# Patient Record
Sex: Male | Born: 1990
Health system: Southern US, Community
[De-identification: ages and names within clinical notes are randomized; demographics above are authoritative.]

## PROBLEM LIST (undated history)

## (undated) DIAGNOSIS — F32A Depression, unspecified: Secondary | ICD-10-CM

## (undated) DIAGNOSIS — M549 Dorsalgia, unspecified: Secondary | ICD-10-CM

## (undated) DIAGNOSIS — G8929 Other chronic pain: Secondary | ICD-10-CM

## (undated) HISTORY — PX: OTHER SURGICAL HISTORY: SHX169

---
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
## 2010-02-04 ENCOUNTER — Emergency Department (HOSPITAL_BASED_OUTPATIENT_CLINIC_OR_DEPARTMENT_OTHER)
Admission: EM | Admit: 2010-02-04 | Discharge: 2010-02-04 | Payer: Self-pay | Source: Home / Self Care | Admitting: Emergency Medicine

## 2010-02-04 LAB — CBC
HCT: 41.2 % (ref 39.0–52.0)
MCH: 27.5 pg (ref 26.0–34.0)
MCHC: 33.7 g/dL (ref 30.0–36.0)
MCV: 81.4 fL (ref 78.0–100.0)
RDW: 11.9 % (ref 11.5–15.5)
WBC: 4.3 10*3/uL (ref 4.0–10.5)

## 2010-02-04 LAB — COMPREHENSIVE METABOLIC PANEL
ALT: 50 U/L (ref 0–53)
AST: 66 U/L — ABNORMAL HIGH (ref 0–37)
CO2: 29 mEq/L (ref 19–32)
Chloride: 103 mEq/L (ref 96–112)
Creatinine, Ser: 0.8 mg/dL (ref 0.4–1.5)
GFR calc Af Amer: >60 mL/min (ref >60)
GFR calc non Af Amer: >60 mL/min (ref >60)
Total Bilirubin: 1.3 mg/dL — ABNORMAL HIGH (ref 0.3–1.2)

## 2010-02-04 LAB — DIFFERENTIAL
Eosinophils Relative: 2 % (ref 0–5)
Lymphocytes Relative: 43 % (ref 12–46)
Lymphs Abs: 1.9 10*3/uL (ref 0.7–4.0)
Monocytes Absolute: 0.5 10*3/uL (ref 0.1–1.0)
Monocytes Relative: 11 % (ref 3–12)

## 2010-02-04 LAB — URINALYSIS, ROUTINE W REFLEX MICROSCOPIC
Bilirubin Urine: NEGATIVE
Hgb urine dipstick: NEGATIVE
Specific Gravity, Urine: 1.019 (ref 1.005–1.030)
Urobilinogen, UA: 1 mg/dL (ref 0.0–1.0)

## 2010-02-04 LAB — POCT TOXICOLOGY PANEL: Tetrahydrocannabinol: POSITIVE

## 2013-07-25 ENCOUNTER — Encounter (HOSPITAL_BASED_OUTPATIENT_CLINIC_OR_DEPARTMENT_OTHER): Payer: Self-pay | Admitting: Emergency Medicine

## 2013-07-25 ENCOUNTER — Emergency Department (HOSPITAL_BASED_OUTPATIENT_CLINIC_OR_DEPARTMENT_OTHER)
Admission: EM | Admit: 2013-07-25 | Discharge: 2013-07-25 | Disposition: A | Discharge status: Home / Self Care | Payer: Medicaid Other | Attending: Emergency Medicine | Admitting: Emergency Medicine

## 2013-07-25 DIAGNOSIS — R369 Urethral discharge, unspecified: Secondary | ICD-10-CM | POA: Diagnosis present

## 2013-07-25 DIAGNOSIS — F17200 Nicotine dependence, unspecified, uncomplicated: Secondary | ICD-10-CM | POA: Diagnosis not present

## 2013-07-25 DIAGNOSIS — R319 Hematuria, unspecified: Secondary | ICD-10-CM | POA: Insufficient documentation

## 2013-07-25 DIAGNOSIS — Z880 Allergy status to penicillin: Secondary | ICD-10-CM | POA: Insufficient documentation

## 2013-07-25 LAB — CBC WITH DIFFERENTIAL/PLATELET
BASOS ABS: 0 10*3/uL (ref 0.0–0.1)
BASOS PCT: 0 % (ref 0–1)
Eosinophils Absolute: 0.1 10*3/uL (ref 0.0–0.7)
Eosinophils Relative: 2 % (ref 0–5)
HCT: 41.2 % (ref 39.0–52.0)
Hemoglobin: 13.6 g/dL (ref 13.0–17.0)
Lymphocytes Relative: 40 % (ref 12–46)
Lymphs Abs: 2.2 10*3/uL (ref 0.7–4.0)
MCH: 27.7 pg (ref 26.0–34.0)
MCHC: 33 g/dL (ref 30.0–36.0)
MCV: 83.9 fL (ref 78.0–100.0)
Monocytes Absolute: 0.7 10*3/uL (ref 0.1–1.0)
Monocytes Relative: 13 % — ABNORMAL HIGH (ref 3–12)
NEUTROS PCT: 45 % (ref 43–77)
Neutro Abs: 2.4 10*3/uL (ref 1.7–7.7)
PLATELETS: 287 10*3/uL (ref 150–400)
RBC: 4.91 MIL/uL (ref 4.22–5.81)
RDW: 12.6 % (ref 11.5–15.5)
WBC: 5.3 10*3/uL (ref 4.0–10.5)

## 2013-07-25 LAB — BASIC METABOLIC PANEL
Anion gap: 15 (ref 5–15)
BUN: 7 mg/dL (ref 6–23)
CALCIUM: 9.7 mg/dL (ref 8.4–10.5)
CO2: 25 mEq/L (ref 19–32)
Chloride: 100 mEq/L (ref 96–112)
Creatinine, Ser: 0.8 mg/dL (ref 0.50–1.35)
Glucose, Bld: 101 mg/dL — ABNORMAL HIGH (ref 70–99)
POTASSIUM: 4.1 meq/L (ref 3.7–5.3)
Sodium: 140 mEq/L (ref 137–147)

## 2013-07-25 LAB — URINALYSIS, ROUTINE W REFLEX MICROSCOPIC
Glucose, UA: NEGATIVE mg/dL
Ketones, ur: 15 mg/dL — AB
NITRITE: POSITIVE — AB
PH: 6 (ref 5.0–8.0)
Protein, ur: 100 mg/dL — AB
SPECIFIC GRAVITY, URINE: 1.03 (ref 1.005–1.030)
Urobilinogen, UA: 4 mg/dL — ABNORMAL HIGH (ref 0.0–1.0)

## 2013-07-25 LAB — URINE MICROSCOPIC-ADD ON

## 2013-07-25 NOTE — ED Provider Notes (Signed)
CSN: [id number]     Arrival date & time 07/25/13  1156 History   First MD Initiated Contact with Patient 07/25/13 1212     Chief Complaint  Patient presents with  . Penile Discharge    Bloody     (Consider location/radiation/quality/duration/timing/severity/associated sxs/prior Treatment) Patient is a 23 y.o. male presenting with hematuria. The history is provided by the patient. No language interpreter was used.  Hematuria This is a new problem. The current episode started today. The problem occurs constantly. The problem has been unchanged. Pertinent negatives include no abdominal pain or fever. Nothing aggravates the symptoms. He has tried nothing for the symptoms. The treatment provided no relief.  Pt reports after sex he noticed blood in with semen.   Pt reports he urinated and noticed blood at end of urine stream.     History reviewed. No pertinent past medical history. History reviewed. No pertinent past surgical history. No family history on file. History  Substance Use Topics  . Smoking status: Current Every Day Smoker -- 1.00 packs/day    Types: Cigarettes  . Smokeless tobacco: Never Used  . Alcohol Use: Yes     Comment: occassional    Review of Systems  Constitutional: Negative for fever.  Gastrointestinal: Negative for abdominal pain.  Genitourinary: Positive for hematuria.  All other systems reviewed and are negative.     Allergies  Penicillins  Home Medications   Prior to Admission medications   Not on File   BP 129/63  Pulse 40  Temp(Src) 97.9 F (36.6 C) (Oral)  Resp 18  Ht 6' (1.829 m)  Wt 212 lb (96.163 kg)  BMI 28.75 kg/m2  SpO2 100% Physical Exam  Nursing note and vitals reviewed. Constitutional: He is oriented to person, place, and time. He appears well-developed and well-nourished.  HENT:  Head: Normocephalic.  Eyes: EOM are normal. Pupils are equal, round, and reactive to light.  Neck: Normal range of motion.  Pulmonary/Chest: Effort  normal.  Abdominal: Soft. He exhibits no distension.  Genitourinary: Penis normal. No penile tenderness.  Musculoskeletal: Normal range of motion.  Neurological: He is alert and oriented to person, place, and time.  Psychiatric: He has a normal mood and affect.    ED Course  Procedures (including critical care time) Labs Review Labs Reviewed  URINALYSIS, ROUTINE W REFLEX MICROSCOPIC - Abnormal; Notable for the following:    Color, Urine RED (*)    APPearance TURBID (*)    Hgb urine dipstick LARGE (*)    Bilirubin Urine LARGE (*)    Ketones, ur 15 (*)    Protein, ur 100 (*)    Urobilinogen, UA 4.0 (*)    Nitrite POSITIVE (*)    Leukocytes, UA MODERATE (*)    All other components within normal limits  URINE MICROSCOPIC-ADD ON - Abnormal; Notable for the following:    Bacteria, UA FEW (*)    All other components within normal limits  GC/CHLAMYDIA PROBE AMP    Imaging Review No results found.   EKG Interpretation None      MDM   Final diagnoses:  Hematuria    Pt advised no sex or masterbation x 3 days,  Drink plenty of fluids  Follow up with urology if bleeding persist      Elson AreasLeslie K Sofia, PA-C 07/25/13 1758

## 2013-07-25 NOTE — ED Notes (Signed)
Patient states he woke up this morning around 0400 this morning with an erection.  States when the erection went down, he ejaculate was blood mixed with semen.  States he than began to have bloody discharge.  States when urinating, his urine is normal color with small blood clots at the end of the stream.  Denies pain. States he had unprotected intercourse one to two weeks ago.

## 2013-07-25 NOTE — Discharge Instructions (Signed)
Hematuria, Adult  Hematuria is blood in your urine. It can be caused by a bladder infection, kidney infection, prostate infection, kidney stone, or cancer of your urinary tract. Infections can usually be treated with medicine, and a kidney stone usually will pass through your urine. If neither of these is the cause of your hematuria, further workup to find out the reason may be needed.  It is very important that you tell your health care provider about any blood you see in your urine, even if the blood stops without treatment or happens without causing pain. Blood in your urine that happens and then stops and then happens again can be a symptom of a very serious condition. Also, pain is not a symptom in the initial stages of many urinary cancers.  HOME CARE INSTRUCTIONS   · Drink lots of fluid, 3-4 quarts a day. If you have been diagnosed with an infection, cranberry juice is especially recommended, in addition to large amounts of water.  · Avoid caffeine, tea, and carbonated beverages, because they tend to irritate the bladder.  · Avoid alcohol because it may irritate the prostate.  · Only take over-the-counter or prescription medicines for pain, discomfort, or fever as directed by your health care provider.  · If you have been diagnosed with a kidney stone, follow your health care provider's instructions regarding straining your urine to catch the stone.  · Empty your bladder often. Avoid holding urine for long periods of time.  · After a bowel movement, women should cleanse front to back. Use each tissue only once.  · Empty your bladder before and after sexual intercourse if you are a male.  SEEK MEDICAL CARE IF:  You develop back pain, fever, a feeling of sickness in your stomach (nausea), or vomiting or if your symptoms are not better in 3 days. Return sooner if you are getting worse.  SEEK IMMEDIATE MEDICAL CARE IF:   · You have a persistent fever, with a temperature of 101.8°F (38.8°C) or greater.  · You  develop severe vomiting and are unable to keep the medicine down.  · You develop severe back or abdominal pain despite taking your medicines.  · You begin passing a large amount of blood or clots in your urine.  · You feel extremely weak or faint, or you pass out.  MAKE SURE YOU:   · Understand these instructions.  · Will watch your condition.  · Will get help right away if you are not doing well or get worse.  Document Released: 12/28/2004 Document Revised: 10/18/2012 Document Reviewed: 08/28/2012  [employer]® Patient Information ©2015 [employer]. This information is not intended to replace advice given to you by your health care provider. Make sure you discuss any questions you have with your health care provider.

## 2013-07-26 LAB — GC/CHLAMYDIA PROBE AMP
CT Probe RNA: NEGATIVE
GC PROBE AMP APTIMA: NEGATIVE

## 2013-07-27 NOTE — ED Provider Notes (Signed)
Medical screening examination/treatment/procedure(s) were performed by non-physician practitioner and as supervising physician I was immediately available for consultation/collaboration.   EKG Interpretation None       Juliet RudeNathan R. Rubin PayorPickering, MD 07/27/13 1049

## 2013-07-29 ENCOUNTER — Emergency Department (HOSPITAL_BASED_OUTPATIENT_CLINIC_OR_DEPARTMENT_OTHER)
Admission: EM | Admit: 2013-07-29 | Discharge: 2013-07-29 | Disposition: A | Discharge status: Home / Self Care | Payer: Medicaid Other | Attending: Emergency Medicine | Admitting: Emergency Medicine

## 2013-07-29 ENCOUNTER — Encounter (HOSPITAL_BASED_OUTPATIENT_CLINIC_OR_DEPARTMENT_OTHER): Payer: Self-pay | Admitting: Emergency Medicine

## 2013-07-29 DIAGNOSIS — N342 Other urethritis: Secondary | ICD-10-CM | POA: Diagnosis not present

## 2013-07-29 DIAGNOSIS — F17200 Nicotine dependence, unspecified, uncomplicated: Secondary | ICD-10-CM | POA: Diagnosis not present

## 2013-07-29 DIAGNOSIS — B379 Candidiasis, unspecified: Secondary | ICD-10-CM | POA: Insufficient documentation

## 2013-07-29 DIAGNOSIS — Z880 Allergy status to penicillin: Secondary | ICD-10-CM | POA: Diagnosis not present

## 2013-07-29 DIAGNOSIS — R319 Hematuria, unspecified: Secondary | ICD-10-CM | POA: Diagnosis present

## 2013-07-29 LAB — HEPATIC FUNCTION PANEL
ALK PHOS: 82 U/L (ref 39–117)
ALT: 11 U/L (ref 0–53)
AST: 19 U/L (ref 0–37)
Albumin: 4.5 g/dL (ref 3.5–5.2)
BILIRUBIN TOTAL: 0.6 mg/dL (ref 0.3–1.2)
Bilirubin, Direct: <0.2 mg/dL (ref 0.0–0.3)
Total Protein: 8.2 g/dL (ref 6.0–8.3)

## 2013-07-29 LAB — URINALYSIS, ROUTINE W REFLEX MICROSCOPIC
Bilirubin Urine: NEGATIVE
Glucose, UA: NEGATIVE mg/dL
Ketones, ur: NEGATIVE mg/dL
LEUKOCYTES UA: NEGATIVE
Nitrite: NEGATIVE
Protein, ur: NEGATIVE mg/dL
Specific Gravity, Urine: 1.023 (ref 1.005–1.030)
UROBILINOGEN UA: 1 mg/dL (ref 0.0–1.0)
pH: 6 (ref 5.0–8.0)

## 2013-07-29 LAB — URINE MICROSCOPIC-ADD ON

## 2013-07-29 LAB — RPR

## 2013-07-29 LAB — HIV ANTIBODY (ROUTINE TESTING W REFLEX): HIV 1&2 Ab, 4th Generation: NONREACTIVE

## 2013-07-29 MED ORDER — LIDOCAINE HCL (PF) 1 % IJ SOLN
INTRAMUSCULAR | Status: AC
  Administered 2013-07-29: 18:00:00
  Filled 2013-07-29: qty 5

## 2013-07-29 MED ORDER — CIPROFLOXACIN HCL 500 MG PO TABS: 500.0000 mg | ORAL_TABLET | Freq: Two times a day (BID) | ORAL | Status: DC

## 2013-07-29 MED ORDER — FLUCONAZOLE 50 MG PO TABS
150.0000 mg | ORAL_TABLET | Freq: Once | ORAL | Status: AC
  Administered 2013-07-29: 150 mg via ORAL
  Filled 2013-07-29 (×2): qty 1

## 2013-07-29 MED ORDER — AZITHROMYCIN 250 MG PO TABS
1000.0000 mg | ORAL_TABLET | Freq: Once | ORAL | Status: AC
  Administered 2013-07-29: 1000 mg via ORAL
  Filled 2013-07-29: qty 4

## 2013-07-29 MED ORDER — CIPROFLOXACIN HCL 500 MG PO TABS
500.0000 mg | ORAL_TABLET | Freq: Once | ORAL | Status: AC
  Administered 2013-07-29: 500 mg via ORAL
  Filled 2013-07-29: qty 1

## 2013-07-29 MED ORDER — CEFTRIAXONE SODIUM 1 G IJ SOLR
1.0000 g | Freq: Once | INTRAMUSCULAR | Status: AC
  Administered 2013-07-29: 1 g via INTRAMUSCULAR
  Filled 2013-07-29: qty 10

## 2013-07-29 MED ORDER — FLUCONAZOLE 200 MG PO TABS: 200.0000 mg | ORAL_TABLET | Freq: Every day | ORAL | Status: DC

## 2013-07-29 NOTE — ED Notes (Signed)
Pt presents to ED with complaints of blood in his urine. Pt reports he was here 3 days ago for the same thing and is is not getting any better.

## 2013-07-29 NOTE — ED Provider Notes (Signed)
CSN: [id number]     Arrival date & time 07/29/13  1503 History   First MD Initiated Contact with Patient 07/29/13 1654     Chief Complaint  Patient presents with  . Hematuria     (Consider location/radiation/quality/duration/timing/severity/associated sxs/prior Treatment) HPI  Andrew [name] is a 23 y.o. male complaining of hematuria onset approximately 4 days ago. Patient states that he had to strain to begin urination this morning. He denies any purulent urethral discharge or dysuria. Patient had unprotected sex when the condom that he was wearing 2 weeks ago broke. He denies any known sex, testicular pain swelling, fever, chills, rash, lesion, abdominal pain, nausea vomiting. Patient was seen for hematuria 3 days ago. GC chlamydia that was apparently obtained by UA was negative.  History reviewed. No pertinent past medical history. History reviewed. No pertinent past surgical history. History reviewed. No pertinent family history. History  Substance Use Topics  . Smoking status: Current Every Day Smoker -- 1.00 packs/day    Types: Cigarettes  . Smokeless tobacco: Never Used  . Alcohol Use: Yes     Comment: occassional    Review of Systems  10 systems reviewed and found to be negative, except as noted in the HPI.   Allergies  Penicillins  Home Medications   Prior to Admission medications   Medication Sig Start Date End Date Taking? Authorizing Provider  ciprofloxacin (CIPRO) 500 MG tablet Take 1 tablet (500 mg total) by mouth every 12 (twelve) hours. 07/29/13   Aeden Matranga, PA-C  fluconazole (DIFLUCAN) 200 MG tablet Take 1 tablet (200 mg total) by mouth daily. 07/29/13   Devynn Scheff, PA-C   BP 153/86  Pulse 50  Temp(Src) 98.1 F (36.7 C) (Oral)  Resp 18  Ht 6' (1.829 m)  Wt 212 lb (96.163 kg)  BMI 28.75 kg/m2  SpO2 100% Physical Exam  Nursing note and vitals reviewed. Constitutional: He is oriented to person, place, and time. He appears well-developed  and well-nourished. No distress.  HENT:  Head: Normocephalic and atraumatic.  Mouth/Throat: Oropharynx is clear and moist.  Eyes: Conjunctivae and EOM are normal.  Neck: Normal range of motion. Neck supple.  Cardiovascular: Normal rate, regular rhythm and intact distal pulses.   Pulmonary/Chest: Effort normal and breath sounds normal. No stridor.  Abdominal: Soft. Bowel sounds are normal. He exhibits no distension and no mass. There is no tenderness. There is no rebound and no guarding.  Genitourinary: No penile tenderness.  GU exam chaperoned by nurse:  No rashes or lesion, no testicular pain or swelling. No urethral discharge.  Musculoskeletal: Normal range of motion.  Neurological: He is alert and oriented to person, place, and time.  Psychiatric: He has a normal mood and affect.    ED Course  Procedures (including critical care time) Labs Review Labs Reviewed  URINALYSIS, ROUTINE W REFLEX MICROSCOPIC - Abnormal; Notable for the following:    APPearance CLOUDY (*)    Hgb urine dipstick LARGE (*)    All other components within normal limits  URINE MICROSCOPIC-ADD ON - Abnormal; Notable for the following:    Bacteria, UA MANY (*)    All other components within normal limits  GC/CHLAMYDIA PROBE AMP  URINE CULTURE  RPR  HIV ANTIBODY (ROUTINE TESTING)  HEPATIC FUNCTION PANEL    Imaging Review No results found.   EKG Interpretation None      MDM   Final diagnoses:  Urethritis  Yeast infection    Filed Vitals:   07/29/13 1513 07/29/13 1757  BP: 117/67 153/86  Pulse: 100 50  Temp: 98.3 F (36.8 C) 98.1 F (36.7 C)  TempSrc: Oral Oral  Resp: 18 18  Height: 6' (1.829 m)   Weight: 212 lb (96.163 kg)   SpO2: 100% 100%    Medications  ciprofloxacin (CIPRO) tablet 500 mg (500 mg Oral Given 07/29/13 1730)  cefTRIAXone (ROCEPHIN) injection 1 g (1 g Intramuscular Given 07/29/13 1731)  azithromycin (ZITHROMAX) tablet 1,000 mg (1,000 mg Oral Given 07/29/13 1730)   fluconazole (DIFLUCAN) tablet 150 mg (150 mg Oral Given 07/29/13 1730)  lidocaine (PF) (XYLOCAINE) 1 % injection (  Given by Other 07/29/13 1743)    Andrew [name] is a 23 y.o. male presenting with hematuria and difficulty initiating urine stream. Urinalysis today shows a large amount of hemoglobin with many bacteria and also many yeast. I am going to recheck a gonorrhea and Chlamydia with urethral swab. We'll treat for urinary tract infection, STD and fungal infection. HIV and RPR pending. Patient asked to follow closely with urology.  Evaluation does not show pathology that would require ongoing emergent intervention or inpatient treatment. Pt is hemodynamically stable and mentating appropriately. Discussed findings and plan with patient/guardian, who agrees with care plan. All questions answered. Return precautions discussed and outpatient follow up given.   Discharge Medication List as of 07/29/2013  5:52 PM    START taking these medications   Details  ciprofloxacin (CIPRO) 500 MG tablet Take 1 tablet (500 mg total) by mouth every 12 (twelve) hours., Starting 07/29/2013, Until Discontinued, Print    fluconazole (DIFLUCAN) 200 MG tablet Take 1 tablet (200 mg total) by mouth daily., Starting 07/29/2013, Until Discontinued, Print             Wynetta Emery, PA-C 07/29/13 1824

## 2013-07-29 NOTE — Discharge Instructions (Signed)
Take your antibiotics as directed and to completion. You should never have any leftover antibiotics! Push fluids and stay well hydrated.   Do not hesitate to return to the Emergency Department for any new, worsening or concerning symptoms.   If you do not have a primary care doctor you can establish one at the   [facility]: [address] [place] [id number] [phone]  After you establish care. Let them know you were seen in the emergency room. They must obtain records for further management.    Candida Infection, Adult A candida infection (also called yeast, fungus and Monilia infection) is an overgrowth of yeast that can occur anywhere on the body. A yeast infection commonly occurs in warm, moist body areas. Usually, the infection remains localized but can spread to become a systemic infection. A yeast infection may be a sign of a more severe disease such as diabetes, leukemia, or AIDS. A yeast infection can occur in both men and women. In women, Candida vaginitis is a vaginal infection. It is one of the most common causes of vaginitis. Men usually do not have symptoms or know they have an infection until other problems develop. Men may find out they have a yeast infection because their sex partner has a yeast infection. Uncircumcised men are more likely to get a yeast infection than circumcised men. This is because the uncircumcised glans is not exposed to air and does not remain as dry as that of a circumcised glans. Older adults may develop yeast infections around dentures. CAUSES  Women  Antibiotics.  Steroid medication taken for a long time.  Being overweight (obese).  Diabetes.  Poor immune condition.  Certain serious medical conditions.  Immune suppressive medications for organ transplant patients.  Chemotherapy.  Pregnancy.  Menstration.  Stress and fatigue.  Intravenous drug use.  Oral contraceptives.  Wearing tight-fitting clothes in  the crotch area.  Catching it from a sex partner who has a yeast infection.  Spermicide.  Intravenous, urinary, or other catheters. Men  Catching it from a sex partner who has a yeast infection.  Having oral or anal sex with a person who has the infection.  Spermicide.  Diabetes.  Antibiotics.  Poor immune system.  Medications that suppress the immune system.  Intravenous drug use.  Intravenous, urinary, or other catheters. SYMPTOMS  Women  Thick, white vaginal discharge.  Vaginal itching.  Redness and swelling in and around the vagina.  Irritation of the lips of the vagina and perineum.  Blisters on the vaginal lips and perineum.  Painful sexual intercourse.  Low blood sugar (hypoglycemia).  Painful urination.  Bladder infections.  Intestinal problems such as constipation, indigestion, bad breath, bloating, increase in gas, diarrhea, or loose stools. Men  Men may develop intestinal problems such as constipation, indigestion, bad breath, bloating, increase in gas, diarrhea, or loose stools.  Dry, cracked skin on the penis with itching or discomfort.  Jock itch.  Dry, flaky skin.  Athlete's foot.  Hypoglycemia. DIAGNOSIS  Women  A history and an exam are performed.  The discharge may be examined under a microscope.  A culture may be taken of the discharge. Men  A history and an exam are performed.  Any discharge from the penis or areas of cracked skin will be looked at under the microscope and cultured.  Stool samples may be cultured. TREATMENT  Women  Vaginal antifungal suppositories and creams.  Medicated creams to decrease irritation and itching on the outside of the  vagina.  Warm compresses to the perineal area to decrease swelling and discomfort.  Oral antifungal medications.  Medicated vaginal suppositories or cream for repeated or recurrent infections.  Wash and dry the irritation areas before applying the cream.  Eating  yogurt with lactobacillus may help with prevention and treatment.  Sometimes painting the vagina with gentian violet solution may help if creams and suppositories do not work. Men  Antifungal creams and oral antifungal medications.  Sometimes treatment must continue for 30 days after the symptoms go away to prevent recurrence. HOME CARE INSTRUCTIONS  Women  Use cotton underwear and avoid tight-fitting clothing.  Avoid colored, scented toilet paper and deodorant tampons or pads.  Do not douche.  Keep your diabetes under control.  Finish all the prescribed medications.  Keep your skin clean and dry.  Consume milk or yogurt with lactobacillus active culture regularly. If you get frequent yeast infections and think that is what the infection is, there are over-the-counter medications that you can get. If the infection does not show healing in 3 days, talk to your caregiver.  Tell your sex partner you have a yeast infection. Your partner may need treatment also, especially if your infection does not clear up or recurs. Men  Keep your skin clean and dry.  Keep your diabetes under control.  Finish all prescribed medications.  Tell your sex partner that you have a yeast infection so they can be treated if necessary. SEEK MEDICAL CARE IF:   Your symptoms do not clear up or worsen in one week after treatment.  You have an oral temperature above 102 F (38.9 C).  You have trouble swallowing or eating for a prolonged time.  You develop blisters on and around your vagina.  You develop vaginal bleeding and it is not your menstrual period.  You develop abdominal pain.  You develop intestinal problems as mentioned above.  You get weak or lightheaded.  You have painful or increased urination.  You have pain during sexual intercourse. MAKE SURE YOU:   Understand these instructions.  Will watch your condition.  Will get help right away if you are not doing well or get  worse. Document Released: 02/05/2004 Document Revised: 03/22/2011 Document Reviewed: 05/19/2009 [facility] Patient Information 2015 [place], [place]. This information is not intended to replace advice given to you by your health care provider. Make sure you discuss any questions you have with your health care provider.

## 2013-07-30 LAB — GC/CHLAMYDIA PROBE AMP
CT PROBE, AMP APTIMA: NEGATIVE
GC Probe RNA: NEGATIVE

## 2013-07-30 NOTE — ED Provider Notes (Signed)
Medical screening examination/treatment/procedure(s) were performed by non-physician practitioner and as supervising physician I was immediately available for consultation/collaboration.   EKG Interpretation None       Andrew HornJohn M Holly Iannaccone, MD 07/30/13 2210

## 2013-07-31 LAB — URINE CULTURE
COLONY COUNT: NO GROWTH
Culture: NO GROWTH

## 2013-10-14 ENCOUNTER — Encounter (HOSPITAL_BASED_OUTPATIENT_CLINIC_OR_DEPARTMENT_OTHER): Payer: Self-pay | Admitting: Emergency Medicine

## 2013-10-14 ENCOUNTER — Emergency Department (HOSPITAL_BASED_OUTPATIENT_CLINIC_OR_DEPARTMENT_OTHER): Payer: Medicaid Other

## 2013-10-14 ENCOUNTER — Emergency Department (HOSPITAL_BASED_OUTPATIENT_CLINIC_OR_DEPARTMENT_OTHER)
Admission: EM | Admit: 2013-10-14 | Discharge: 2013-10-14 | Disposition: A | Discharge status: Home / Self Care | Payer: Medicaid Other | Attending: Emergency Medicine | Admitting: Emergency Medicine

## 2013-10-14 DIAGNOSIS — Z720 Tobacco use: Secondary | ICD-10-CM | POA: Insufficient documentation

## 2013-10-14 DIAGNOSIS — Z792 Long term (current) use of antibiotics: Secondary | ICD-10-CM | POA: Insufficient documentation

## 2013-10-14 DIAGNOSIS — Y92410 Unspecified street and highway as the place of occurrence of the external cause: Secondary | ICD-10-CM | POA: Diagnosis not present

## 2013-10-14 DIAGNOSIS — S39012A Strain of muscle, fascia and tendon of lower back, initial encounter: Secondary | ICD-10-CM

## 2013-10-14 DIAGNOSIS — Z880 Allergy status to penicillin: Secondary | ICD-10-CM | POA: Diagnosis not present

## 2013-10-14 DIAGNOSIS — S161XXA Strain of muscle, fascia and tendon at neck level, initial encounter: Secondary | ICD-10-CM

## 2013-10-14 DIAGNOSIS — Y9389 Activity, other specified: Secondary | ICD-10-CM | POA: Insufficient documentation

## 2013-10-14 DIAGNOSIS — S0990XA Unspecified injury of head, initial encounter: Secondary | ICD-10-CM

## 2013-10-14 DIAGNOSIS — S098XXA Other specified injuries of head, initial encounter: Secondary | ICD-10-CM | POA: Insufficient documentation

## 2013-10-14 MED ORDER — HYDROCODONE-ACETAMINOPHEN 5-325 MG PO TABS: 1.0000 | ORAL_TABLET | Freq: Four times a day (QID) | ORAL | Status: DC | PRN

## 2013-10-14 MED ORDER — IBUPROFEN 800 MG PO TABS
800.0000 mg | ORAL_TABLET | Freq: Once | ORAL | Status: AC
  Administered 2013-10-14: 800 mg via ORAL
  Filled 2013-10-14: qty 1

## 2013-10-14 MED ORDER — IBUPROFEN 800 MG PO TABS
800.0000 mg | ORAL_TABLET | Freq: Once | ORAL | Status: DC
  Filled 2013-10-14: qty 1
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
## 2014-05-13 ENCOUNTER — Emergency Department (HOSPITAL_COMMUNITY)
Admission: EM | Admit: 2014-05-13 | Discharge: 2014-05-13 | Disposition: A | Discharge status: Home / Self Care | Payer: Self-pay | Attending: Emergency Medicine | Admitting: Emergency Medicine

## 2014-05-13 ENCOUNTER — Encounter (HOSPITAL_COMMUNITY): Payer: Self-pay | Admitting: Oncology

## 2014-05-13 DIAGNOSIS — R000 Tachycardia, unspecified: Secondary | ICD-10-CM | POA: Insufficient documentation

## 2014-05-13 DIAGNOSIS — S60511A Abrasion of right hand, initial encounter: Secondary | ICD-10-CM | POA: Insufficient documentation

## 2014-05-13 DIAGNOSIS — F1210 Cannabis abuse, uncomplicated: Secondary | ICD-10-CM | POA: Insufficient documentation

## 2014-05-13 DIAGNOSIS — S70311A Abrasion, right thigh, initial encounter: Secondary | ICD-10-CM | POA: Insufficient documentation

## 2014-05-13 DIAGNOSIS — Y998 Other external cause status: Secondary | ICD-10-CM | POA: Insufficient documentation

## 2014-05-13 DIAGNOSIS — S90811A Abrasion, right foot, initial encounter: Secondary | ICD-10-CM | POA: Insufficient documentation

## 2014-05-13 DIAGNOSIS — F23 Brief psychotic disorder: Secondary | ICD-10-CM

## 2014-05-13 DIAGNOSIS — S60512A Abrasion of left hand, initial encounter: Secondary | ICD-10-CM | POA: Insufficient documentation

## 2014-05-13 DIAGNOSIS — Y929 Unspecified place or not applicable: Secondary | ICD-10-CM | POA: Insufficient documentation

## 2014-05-13 DIAGNOSIS — Y939 Activity, unspecified: Secondary | ICD-10-CM | POA: Insufficient documentation

## 2014-05-13 DIAGNOSIS — F29 Unspecified psychosis not due to a substance or known physiological condition: Secondary | ICD-10-CM | POA: Insufficient documentation

## 2014-05-13 DIAGNOSIS — S40811A Abrasion of right upper arm, initial encounter: Secondary | ICD-10-CM | POA: Insufficient documentation

## 2014-05-13 LAB — RAPID URINE DRUG SCREEN, HOSP PERFORMED
AMPHETAMINES: NOT DETECTED
BENZODIAZEPINES: NOT DETECTED
Barbiturates: NOT DETECTED
Cocaine: NOT DETECTED
Opiates: NOT DETECTED
Tetrahydrocannabinol: POSITIVE — AB

## 2014-05-13 LAB — COMPREHENSIVE METABOLIC PANEL
ALBUMIN: 4.6 g/dL (ref 3.5–5.0)
ALT: 18 U/L (ref 17–63)
AST: 30 U/L (ref 15–41)
Alkaline Phosphatase: 71 U/L (ref 38–126)
Anion gap: 7 (ref 5–15)
BILIRUBIN TOTAL: 1.1 mg/dL (ref 0.3–1.2)
BUN: 13 mg/dL (ref 6–20)
CHLORIDE: 105 mmol/L (ref 101–111)
CO2: 25 mmol/L (ref 22–32)
CREATININE: 0.99 mg/dL (ref 0.61–1.24)
Calcium: 9.3 mg/dL (ref 8.9–10.3)
GFR calc Af Amer: >60 mL/min (ref >60)
GFR calc non Af Amer: >60 mL/min (ref >60)
GLUCOSE: 90 mg/dL (ref 70–99)
POTASSIUM: 3.6 mmol/L (ref 3.5–5.1)
Sodium: 137 mmol/L (ref 135–145)
Total Protein: 7.8 g/dL (ref 6.5–8.1)

## 2014-05-13 LAB — CBC WITH DIFFERENTIAL/PLATELET
BASOS ABS: 0 10*3/uL (ref 0.0–0.1)
Basophils Relative: 0 % (ref 0–1)
EOS PCT: 0 % (ref 0–5)
Eosinophils Absolute: 0 10*3/uL (ref 0.0–0.7)
HEMATOCRIT: 39.5 % (ref 39.0–52.0)
Hemoglobin: 13.2 g/dL (ref 13.0–17.0)
LYMPHS ABS: 0.7 10*3/uL (ref 0.7–4.0)
LYMPHS PCT: 9 % — AB (ref 12–46)
MCH: 28.4 pg (ref 26.0–34.0)
MCHC: 33.4 g/dL (ref 30.0–36.0)
MCV: 85.1 fL (ref 78.0–100.0)
MONOS PCT: 10 % (ref 3–12)
Monocytes Absolute: 0.7 10*3/uL (ref 0.1–1.0)
Neutro Abs: 6 10*3/uL (ref 1.7–7.7)
Neutrophils Relative %: 81 % — ABNORMAL HIGH (ref 43–77)
Platelets: 288 10*3/uL (ref 150–400)
RBC: 4.64 MIL/uL (ref 4.22–5.81)
RDW: 12.7 % (ref 11.5–15.5)
WBC: 7.4 10*3/uL (ref 4.0–10.5)

## 2014-05-13 LAB — URINALYSIS, ROUTINE W REFLEX MICROSCOPIC
Bilirubin Urine: NEGATIVE
Glucose, UA: NEGATIVE mg/dL
Hgb urine dipstick: NEGATIVE
Ketones, ur: NEGATIVE mg/dL
Leukocytes, UA: NEGATIVE
Nitrite: NEGATIVE
Protein, ur: 30 mg/dL — AB
SPECIFIC GRAVITY, URINE: 1.031 — AB (ref 1.005–1.030)
UROBILINOGEN UA: 0.2 mg/dL (ref 0.0–1.0)
pH: 6 (ref 5.0–8.0)

## 2014-05-13 LAB — URINE MICROSCOPIC-ADD ON

## 2014-05-13 LAB — CK: CK TOTAL: 329 U/L (ref 49–397)

## 2014-05-13 LAB — CBG MONITORING, ED: GLUCOSE-CAPILLARY: 93 mg/dL (ref 70–99)

## 2014-05-13 LAB — ETHANOL: Alcohol, Ethyl (B): <5 mg/dL (ref <5)

## 2014-05-13 MED ORDER — BACITRACIN ZINC 500 UNIT/GM EX OINT: TOPICAL_OINTMENT | Freq: Two times a day (BID) | CUTANEOUS | Status: DC

## 2014-05-13 MED ORDER — ZIPRASIDONE MESYLATE 20 MG IM SOLR
20.0000 mg | Freq: Once | INTRAMUSCULAR | Status: AC
  Administered 2014-05-13: 20 mg via INTRAMUSCULAR

## 2014-05-13 MED ORDER — LORAZEPAM 2 MG/ML IJ SOLN
2.0000 mg | Freq: Once | INTRAMUSCULAR | Status: AC
  Administered 2014-05-13: 2 mg via INTRAMUSCULAR
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
## 2014-05-13 NOTE — Discharge Instructions (Signed)
Cannabis Use Disorder °Cannabis use disorder is a mental disorder. It is not one-time or occasional use of cannabis, more commonly known as marijuana. Cannabis use disorder is the continued, nonmedical use of cannabis that interferes with normal life activities or causes health problems. People with cannabis use disorder get a feeling of extreme pleasure and relaxation from cannabis use. This "high" is very rewarding and causes people to use over and over.  °The mind-altering ingredient in cannabis is know as THC. THC can also interfere with motor coordination, memory, judgment, and accurate sense of space and time. These effects can last for a few days after using cannabis. Regular heavy cannabis use can cause long-lasting problems with thinking and learning. In young people, these problems may be permanent. Cannabis sometimes causes severe anxiety, paranoia, or visual hallucinations. Man-made (synthetic) cannabis-like drugs, such as "spice" and "K2," cause the same effects as THC but are much stronger. Cannabis-like drugs can cause dangerously high blood pressure and heart rate.  °Cannabis use disorder usually starts in the teenage years. It can trigger the development of schizophrenia. It is somewhat more common in men than women. People who have family members with the disorder or existing mental health issues such as depression and posttraumatic stress disorder are more likely to develop cannabis use disorder. People with cannabis use disorder are at higher risk for use of other drugs of abuse.  °SIGNS AND SYMPTOMS °Signs and symptoms of cannabis use disorder include:  °· Use of cannabis in larger amounts or over a longer period than intended.   °· Unsuccessful attempts to cut down or control cannabis use.   °· A lot of time spent obtaining, using, or recovering from the effects of cannabis.   °· A strong desire or urge to use cannabis (cravings).   °· Continued use of cannabis in spite of problems at work,  school, or home because of use.   °· Continued use of cannabis in spite of relationship problems because of use. °· Giving up or cutting down on important life activities because of cannabis use. °· Use of cannabis over and over even in situations when it is physically hazardous, such as when driving a car.   °· Continued use of cannabis in spite of a physical problem that is likely related to use. Physical problems can include: °· Chronic cough. °· Bronchitis. °· Emphysema. °· Throat and lung cancer. °· Continued use of cannabis in spite of a mental problem that is likely related to use. Mental problems can include: °· Psychosis. °· Anxiety. °· Difficulty sleeping. °· Need to use more and more cannabis to get the same effect, or lessened effect over time with use of the same amount (tolerance). °· Having withdrawal symptoms when cannabis use is stopped, or using cannabis to reduce or avoid withdrawal symptoms. Withdrawal symptoms include: °· Irritability or anger. °· Anxiety or restlessness. °· Difficulty sleeping. °· Loss of appetite or weight. °· Aches and pains. °· Shakiness. °· Sweating. °· Chills. °DIAGNOSIS °Cannabis use disorder is diagnosed by your health care provider. You may be asked questions about your cannabis use and how it affects your life. A physical exam may be done. A drug screen may be done. You may be referred to a mental health professional. The diagnosis of cannabis use disorder requires at least two symptoms within 12 months. The type of cannabis use disorder you have depends on the number of symptoms you have. The type may be: °· Mild. Two or three signs and symptoms.   °· Moderate. Four or   five signs and symptoms.   Severe. Six or more signs and symptoms.  TREATMENT Treatment is usually provided by mental health professionals with training in substance use disorders. The following options are available:  Counseling or talk therapy. Talk therapy addresses the reasons you use  cannabis. It also addresses ways to keep you from using again. The goals of talk therapy include:  Identifying and avoiding triggers for use.  Learning how to handle cravings.  Replacing use with healthy activities.  Support groups. Support groups provide emotional support, advice, and guidance.  Medicine. Medicine is used to treat mental health issues that trigger cannabis use or that result from it. HOME CARE INSTRUCTIONS  Take medicines only as directed by your health care provider.  Check with your health care provider before starting any new medicines.  Keep all follow-up visits as directed by your health care provider. SEEK MEDICAL CARE IF:  You are not able to take your medicines as directed.  Your symptoms get worse. SEEK IMMEDIATE MEDICAL CARE IF: You have serious thoughts about hurting yourself or others. FOR MORE INFORMATION  National Institute on Drug Abuse: http://www.price-smith.com/www.drugabuse.gov  Substance Abuse and Mental Health Services Administration: SkateOasis.com.ptwww.samhsa.gov Document Released: 12/26/1999 Document Revised: 05/14/2013 Document Reviewed: 01/10/2013 [facility] Patient Information 2015 [place]. This information is not intended to replace advice given to you by your health care provider. Make sure you discuss any questions you have with your health care provider.  Abrasion An abrasion is a cut or scrape of the skin. Abrasions do not extend through all layers of the skin and most heal within 10 days. It is important to care for your abrasion properly to prevent infection. CAUSES  Most abrasions are caused by falling on, or gliding across, the ground or other surface. When your skin rubs on something, the outer and inner layer of skin rubs off, causing an abrasion. DIAGNOSIS  Your caregiver will be able to diagnose an abrasion during a physical exam.  TREATMENT  Your treatment depends on how large and deep the abrasion is. Generally, your abrasion will be cleaned with water  and a mild soap to remove any dirt or debris. An antibiotic ointment may be put over the abrasion to prevent an infection. A bandage (dressing) may be wrapped around the abrasion to keep it from getting dirty.  You may need a tetanus shot if:  You cannot remember when you had your last tetanus shot.  You have never had a tetanus shot.  The injury broke your skin. If you get a tetanus shot, your arm may swell, get red, and feel warm to the touch. This is common and not a problem. If you need a tetanus shot and you choose not to have one, there is a rare chance of getting tetanus. Sickness from tetanus can be serious.  HOME CARE INSTRUCTIONS   If a dressing was applied, change it at least once a day or as directed by your caregiver. If the bandage sticks, soak it off with warm water.   Wash the area with water and a mild soap to remove all the ointment 2 times a day. Rinse off the soap and pat the area dry with a clean towel.   Reapply any ointment as directed by your caregiver. This will help prevent infection and keep the bandage from sticking. Use gauze over the wound and under the dressing to help keep the bandage from sticking.   Change your dressing right away if it becomes wet or  dirty.   Only take over-the-counter or prescription medicines for pain, discomfort, or fever as directed by your caregiver.   Follow up with your caregiver within 24-48 hours for a wound check, or as directed. If you were not given a wound-check appointment, look closely at your abrasion for redness, swelling, or pus. These are signs of infection. SEEK IMMEDIATE MEDICAL CARE IF:   You have increasing pain in the wound.   You have redness, swelling, or tenderness around the wound.   You have pus coming from the wound.   You have a fever or persistent symptoms for more than 2-3 days.  You have a fever and your symptoms suddenly get worse.  You have a bad smell coming from the wound or dressing.   MAKE SURE YOU:   Understand these instructions.  Will watch your condition.  Will get help right away if you are not doing well or get worse. Document Released: 10/07/2004 Document Revised: 12/15/2011 Document Reviewed: 12/01/2010 [facility] Patient Information 2015 [place], [place]. This information is not intended to replace advice given to you by your health care provider. Make sure you discuss any questions you have with your health care provider.  Emergency Department Resource Guide 1) Find a Doctor and Pay Out of Pocket Although you won't have to find out who is covered by your insurance plan, it is a good idea to ask around and get recommendations. You will then need to call the office and see if the doctor you have chosen will accept you as a new patient and what types of options they offer for patients who are self-pay. Some doctors offer discounts or will set up payment plans for their patients who do not have insurance, but you will need to ask so you aren't surprised when you get to your appointment.  2) Contact Your Local Health Department Not all health departments have doctors that can see patients for sick visits, but many do, so it is worth a call to see if yours does. If you don't know where your local health department is, you can check in your phone book. The CDC also has a tool to help you locate your state's health department, and many state websites also have listings of all of their local health departments.  3) Find a Walk-in Clinic If your illness is not likely to be very severe or complicated, you may want to try a walk in clinic. These are popping up all over the country in pharmacies, drugstores, and shopping centers. They're usually staffed by nurse practitioners or physician assistants that have been trained to treat common illnesses and complaints. They're usually fairly quick and inexpensive. However, if you have serious medical issues or chronic medical problems,  these are probably not your best option.  No Primary Care Doctor: - Call Health Connect at  [phone] - they can help you locate a primary care doctor that  accepts your insurance, provides certain services, etc. - Physician Referral Service- [phone]  Chronic Pain Problems: Organization         Address  Phone   Notes  [facility]  [phone] Patients need to be referred by their primary care doctor.   Medication Assistance: Organization         Address  Phone   Notes  [facility] Medication [facility] [address] [place] [place], [place] [address] [phone] --Must be a resident of [facility] -- Must have NO insurance coverage whatsoever (  no Medicaid/ Medicare, etc.) -- The pt. MUST have a primary care doctor that directs their care regularly and follows them in the community   [facility]  [phone]   [employer]  [phone]    Agencies that provide inexpensive medical care: Organization         Address  Phone   Notes  [facility]  [phone]   [facility]    [phone]   [facility] [address] [place], [place] [address] [phone]   [facility] [address] [place] [place] [phone]   Planned Parenthood    [phone]   [facility]    [phone]   [facility]  [address], [place] Phone:  [phone], Fax:  [phone] Hours of Operation:  9 am - 6 pm, M-F.  Also accepts Medicaid/Medicare and self-pay.  [facility]  [address], [place] Phone: [phone], Fax: [phone]. Hours of Operation:  8:30 am - 5:30 pm, M-F.  Also accepts Medicaid and self-pay.  [facility] [place] [address], [place] Phone: [phone]   [facility] [address] Natasha Bence [place], [place] [phone], Ext. [phone] Mondays &  Thursdays: 7-9 AM.  First 15 patients are seen on a first come, first serve basis.    Medicaid-accepting [facility] Providers:  Organization         Address  Phone   Notes  [facility] [address], [place] [phone] Also accepts self-pay patients.  [facility] [address] Laurell Josephs [place], [place]  [phone]   [facility] [address], [place] [phone]   [facility] [address] [place] [phone]   Renaye Rakers [address], [place]   [phone] Only accepts [place] Access [place] patients after they have their name applied to their card.   Self-Pay (no insurance) in [facility]:  Organization         Address  Phone   Notes  Sickle Cell Patients, [facility] [address] [place], [place] [phone]   [facility] [address] [place], [place] [phone]   [facility] [place]  [address], [place] [phone]   [facility]/Dr. Osei-Bonsu  [address], [place] or [address], [place] [phone] Phone number for both [place] and [place] locations is the same.  [facility] [address] [place] [phone]   [facility] [address] [place] or [address] [phone]   [facility] [address] [place] [phone], phone; [phone], fax Sees patients 1st and 3rd Saturday of every month.  Must not qualify for public or private insurance (i.e. Medicaid, Medicare, [place] Health Choice, Veterans' Benefits)  Household income should be no more than 200% of the poverty level The clinic cannot treat you if you are pregnant or think you are pregnant  Sexually transmitted diseases are not treated at the  clinic.    Dental Care: Organization         Address  Phone  Notes  [facility] Department of Public  Health [facility] [address] [place], [place] [phone] Accepts children up to age 46 who are enrolled in [place] or [place] Health Choice; pregnant women with a Medicaid card; and children who have applied for Medicaid or [place] Health Choice, but were declined, whose parents can pay a reduced fee at time of service.  [facility] Department of [facility]  [address], [place] [phone] Accepts children up to age 26 who are enrolled in [place] or [place] Health Choice; pregnant women with a Medicaid card; and children who have applied for Medicaid or [place] Health Choice, but were declined, whose parents can pay a reduced fee at time of service.  [facility]  [address] [place], [place] [phone] Patients are seen by appointment only. Walk-ins are not accepted. [facility] will see patients 2 years of age and older. Monday - Tuesday (8am-5pm) Most Wednesdays (8:30-5pm) $30 per visit, cash only  [facility] Adult Dental Access PROGRAM  [address], [facility] [phone] Patients are seen by appointment only. Walk-ins are not accepted. [facility] will see patients 69 years of age and older. One Wednesday Evening (Monthly: Volunteer Based).  $30 per visit, cash only  [employer]  [phone] for adults; Children under age 76, call Graduate Pediatric Dentistry at [phone]. Children aged 62-14, please call [phone] to request a pediatric application.  Dental services are provided in all areas of dental care including fillings, crowns and bridges, complete and partial dentures, implants, gum treatment, root canals, and extractions. Preventive care is also provided. Treatment is provided to both adults and children. Patients are selected via a lottery and there is often a  waiting list.   [facility] [address], [place]  [phone] www.[email or web address]   [facility] [address] [place], [place] [phone], Ext. [phone] Second and Fourth Thursday of each month, opens at 6:30 AM; Clinic ends at 9 AM.  Patients are seen on a first-come first-served basis, and a limited number are seen during each clinic.   [facility]  [address] Ether Griffins [place], [place] [phone]   Eligibility Requirements You must have lived in [place], [place], or [place] counties for at least the last three months.   You cannot be eligible for state or federal sponsored [place], including [employer], [place], or [employer].   You generally cannot be eligible for healthcare insurance through your employer.    How to apply: Eligibility screenings are held every Tuesday and Wednesday afternoon from 1:00 pm until 4:00 pm. You do not need an appointment for the interview!  [facility] [address] [place], [place] [phone]   [facility] Health Department  [phone]   [facility] Health Department  [phone]   [facility] Health Department  [phone]    Behavioral Health Resources in the Community: Intensive Outpatient Programs Organization         Address  Phone  Notes  [facility] Services [address], [place], [place] [phone]   [facility] Outpatient [address] [place], [place] [phone]   [facility] [address] [place], [place]  [phone]   [facility] [address],  [place], [place] [phone] or [phone]   Substance Abuse Resources Organization         Address  Phone  Notes  [facility]  [phone]   [facility]  [phone]   [facility] [place]  [phone]   Floydene Flock  [phone]   Residential & Outpatient Substance Abuse  Program  [phone]   Psychological Services Organization         Address  Phone  Notes  [facility] Behavioral Health  [phone]   [facility] Services  [phone]   [facility] Mental Health [address] [place] [phone] or [phone]    Mobile Crisis Teams Organization         Address  Phone  Notes  [facility], Mobile Crisis Care Unit  [phone]   [facility]  [address] [place], [place] [phone]   Doristine Locks [address] [place] [phone]    Self-Help/Support Groups Organization         Address  Phone             Notes  [facility] [place] - variety of support groups  336- I7437963 Call for more information  Narcotics Anonymous (NA), [facility] [address], [employer] [place]  2 meetings at this location   Statistician         Address  Phone  Notes  [facility] [address],    [place]  [phone]   [facility]  [address] [place] [address], [place], [place] [phone]   [facility] [address] [place], [place] [phone] Admissions: 8am-3pm M-F  [facility] [address],    [place], [place] [phone]   [facility] [address] [place], [place], [place] [phone]   [facility] [address],  [place], [place] [phone]   [facility] - Intensive Outpatient [address]ure[address] Josephs [address], [place], [place] [phone]   [facility] (Addiction Recovery [facility] [address] [place]  [place], [place] [phone] or [phone]   [facility] ([facility]) [address], [place], [place] [phone] Accepts Medicaid  [facility] [address],  [place] [phone] Substance Abuse/Addiction Treatment   [facility] Organization          Address  Phone  Notes  [facility]  [phone]   Angie Fava, PhD [address] Ervin Knack [place], [place]   [phone] or [phone]   [facility]   [address] [place], [place] [phone]   [facility] [address] [place], [place] [phone] Insurance/Medicaid/sponsorship through [facility] [address]                                    [place], [place] [phone] Therapy/tele-psych/case  [facility] [address], [place] [phone]    Dr. Lolly Mustache  [phone]   [facility]  [employer] [facility] Dept. 1) [address], [place] 2) [address] [place] 3)  [address], [place] [phone]  [phone]   [facility] Child Abuse Hotline [phone] or [phone] (After Hours)
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
## 2014-05-13 NOTE — ED Notes (Signed)
Dr. Norlene Campbelltter stated that she was not concern with vitals until pt was at a calmer state.
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
## 2014-05-13 NOTE — ED Notes (Signed)
Patient's mother called. Phone call transferred to patient's room.
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
## 2014-05-13 NOTE — ED Notes (Signed)
[employer] at bedside 

## 2015-02-21 ENCOUNTER — Encounter (HOSPITAL_BASED_OUTPATIENT_CLINIC_OR_DEPARTMENT_OTHER): Payer: Self-pay | Admitting: *Deleted

## 2015-02-21 ENCOUNTER — Emergency Department (HOSPITAL_BASED_OUTPATIENT_CLINIC_OR_DEPARTMENT_OTHER)
Admission: EM | Admit: 2015-02-21 | Discharge: 2015-02-21 | Disposition: A | Discharge status: Home / Self Care | Payer: Medicaid Other | Attending: Emergency Medicine | Admitting: Emergency Medicine

## 2015-02-21 DIAGNOSIS — F17210 Nicotine dependence, cigarettes, uncomplicated: Secondary | ICD-10-CM | POA: Insufficient documentation

## 2015-02-21 DIAGNOSIS — L0201 Cutaneous abscess of face: Secondary | ICD-10-CM | POA: Insufficient documentation

## 2015-02-21 NOTE — ED Notes (Signed)
Abscess to his face x 4 days. He squeezed it and got a lot of pus out then it got worse.

## 2015-02-21 NOTE — ED Notes (Signed)
Pt states he will probable leave if he is not out by 3pm

## 2015-02-23 ENCOUNTER — Encounter (HOSPITAL_BASED_OUTPATIENT_CLINIC_OR_DEPARTMENT_OTHER): Payer: Self-pay | Admitting: Emergency Medicine

## 2015-02-23 DIAGNOSIS — L0201 Cutaneous abscess of face: Secondary | ICD-10-CM | POA: Insufficient documentation

## 2015-02-23 DIAGNOSIS — Z880 Allergy status to penicillin: Secondary | ICD-10-CM | POA: Insufficient documentation

## 2015-02-23 DIAGNOSIS — F17210 Nicotine dependence, cigarettes, uncomplicated: Secondary | ICD-10-CM | POA: Diagnosis not present

## 2015-02-23 NOTE — ED Notes (Signed)
Patient has a bump to his right chin

## 2015-02-24 ENCOUNTER — Emergency Department (HOSPITAL_BASED_OUTPATIENT_CLINIC_OR_DEPARTMENT_OTHER)
Admission: EM | Admit: 2015-02-24 | Discharge: 2015-02-24 | Disposition: A | Discharge status: Home / Self Care | Payer: Medicaid Other | Attending: Emergency Medicine | Admitting: Emergency Medicine

## 2015-02-24 DIAGNOSIS — L0201 Cutaneous abscess of face: Secondary | ICD-10-CM

## 2015-02-24 MED ORDER — DOXYCYCLINE HYCLATE 100 MG PO TABS
100.0000 mg | ORAL_TABLET | Freq: Once | ORAL | Status: AC
  Administered 2015-02-24: 100 mg via ORAL
  Filled 2015-02-24: qty 1

## 2015-02-24 MED ORDER — DOXYCYCLINE HYCLATE 100 MG PO CAPS: 100.0000 mg | ORAL_CAPSULE | Freq: Two times a day (BID) | ORAL | Status: DC

## 2015-02-24 NOTE — Discharge Instructions (Signed)

## 2015-02-24 NOTE — ED Provider Notes (Signed)
CSN: [id number]     Arrival date & time 02/23/15  2338 History  By signing my name below, I, Soijett Blue, attest that this documentation has been prepared under the direction and in the presence of Paula Libra, MD. Electronically Signed: Soijett Blue, ED Scribe. 02/24/2015. 12:18 AM.   Chief Complaint  Patient presents with  . Abscess      The history is provided by the patient. No language interpreter was used.    Andrew [name] is a 25 y.o. male who presents to the Emergency Department complaining of worsening abscess to right sided chin x 1 week. Pt reports that the area began as a pimple which he squeezed and noticed a moderate amount of pus. Pt notes that the area decreased in size since he squeezed it but is still moderately tender. He has tried warm compresses/soaks without medications for the relief of his symptoms. He denies fever, chills, and any other symptoms.    History reviewed. No pertinent past medical history. History reviewed. No pertinent past surgical history. History reviewed. No pertinent family history. Social History  Substance Use Topics  . Smoking status: Current Every Day Smoker -- 1.00 packs/day    Types: Cigarettes  . Smokeless tobacco: Never Used  . Alcohol Use: Yes     Comment: occassional    Review of Systems  A complete 10 system review of systems was obtained and all systems are negative except as noted in the HPI and PMH.   Allergies  Penicillins  Home Medications   Prior to Admission medications   Medication Sig Start Date End Date Taking? Authorizing Provider  doxycycline (VIBRAMYCIN) 100 MG capsule Take 1 capsule (100 mg total) by mouth 2 (two) times daily. One po bid x 7 days 02/24/15   Paula Libra, MD   BP 138/77 mmHg  Pulse 71  Temp(Src) 98 F (36.7 C) (Oral)  Resp 18  Ht 6' (1.829 m)  Wt 210 lb (95.255 kg)  BMI 28.47 kg/m2  SpO2 100%   Physical Exam General: Well-developed, well-nourished male in no acute distress;  appearance consistent with age of record HENT: normocephalic; atraumatic; firm tender erythematous nodule to the right mandibular region.  Eyes: pupils equal, round and reactive to light; extraocular muscles intact Neck: supple; right anterior cervical lymphadenopathy. Heart: regular rate and rhythm Lungs: clear to auscultation bilaterally Abdomen: soft; nondistended Extremities: No deformity; full range of motion Neurologic: Awake, alert and oriented; motor function intact in all extremities and symmetric; no facial droop Skin: Warm and dry Psychiatric: Normal mood and affect   ED Course  Procedures (including critical care time) DIAGNOSTIC STUDIES: Oxygen Saturation is 100% on RA, nl by my interpretation.    COORDINATION OF CARE:  12:15 AM- Pt refuses I&D at this time. Will Rx doxycyline x 1 week and inform the pt to follow up PRN.   MDM   Final diagnoses:  Cutaneous abscess of face   I personally performed the services described in this documentation, which was scribed in my presence. The recorded information has been reviewed and is accurate.    Paula Libra, MD 02/24/15 [phone]

## 2015-04-15 ENCOUNTER — Encounter (HOSPITAL_BASED_OUTPATIENT_CLINIC_OR_DEPARTMENT_OTHER): Payer: Self-pay | Admitting: Emergency Medicine

## 2015-04-15 ENCOUNTER — Emergency Department (HOSPITAL_BASED_OUTPATIENT_CLINIC_OR_DEPARTMENT_OTHER)
Admission: EM | Admit: 2015-04-15 | Discharge: 2015-04-15 | Disposition: A | Discharge status: Home / Self Care | Payer: Medicaid Other | Attending: Emergency Medicine | Admitting: Emergency Medicine

## 2015-04-15 DIAGNOSIS — F17210 Nicotine dependence, cigarettes, uncomplicated: Secondary | ICD-10-CM | POA: Insufficient documentation

## 2015-04-15 DIAGNOSIS — Z880 Allergy status to penicillin: Secondary | ICD-10-CM | POA: Insufficient documentation

## 2015-04-15 DIAGNOSIS — R300 Dysuria: Secondary | ICD-10-CM | POA: Insufficient documentation

## 2015-04-15 DIAGNOSIS — Z8619 Personal history of other infectious and parasitic diseases: Secondary | ICD-10-CM | POA: Insufficient documentation

## 2015-04-15 DIAGNOSIS — R369 Urethral discharge, unspecified: Secondary | ICD-10-CM | POA: Diagnosis not present

## 2015-04-15 MED ORDER — CEFTRIAXONE SODIUM 250 MG IJ SOLR
250.0000 mg | Freq: Once | INTRAMUSCULAR | Status: AC
  Administered 2015-04-15: 250 mg via INTRAMUSCULAR
  Filled 2015-04-15: qty 250

## 2015-04-15 MED ORDER — AZITHROMYCIN 250 MG PO TABS
1000.0000 mg | ORAL_TABLET | Freq: Once | ORAL | Status: AC
  Administered 2015-04-15: 1000 mg via ORAL
  Filled 2015-04-15: qty 4

## 2015-04-15 NOTE — ED Provider Notes (Signed)
CSN: [id number]     Arrival date & time 04/15/15  0940 History   First MD Initiated Contact with Patient 04/15/15 [phone]     Chief Complaint  Patient presents with  . Penile Discharge     (Consider location/radiation/quality/duration/timing/severity/associated sxs/prior Treatment) HPI Comments: Patient presents today with complaint of penile discharge.  He reports that he has had a thick greenish colored discharge for the past month.  He does report recent unprotected sex.  He also reports a history of Gonorrhea.  No recent treatment prior to arrival.  He does report associated dysuria.  No urinary frequency or urgency.  No swelling of the penis or scrotum.  No testicular pain.  No fever, chills, nausea, vomiting, or abdominal pain.  No pain with BM.    Patient is a 25 y.o. male presenting with penile discharge. The history is provided by the patient.  Penile Discharge    History reviewed. No pertinent past medical history. History reviewed. No pertinent past surgical history. History reviewed. No pertinent family history. Social History  Substance Use Topics  . Smoking status: Current Every Day Smoker -- 1.00 packs/day    Types: Cigarettes  . Smokeless tobacco: Never Used  . Alcohol Use: Yes     Comment: occassional    Review of Systems  Genitourinary: Positive for discharge.  All other systems reviewed and are negative.     Allergies  Penicillins  Home Medications   Prior to Admission medications   Medication Sig Start Date End Date Taking? Authorizing Provider  doxycycline (VIBRAMYCIN) 100 MG capsule Take 1 capsule (100 mg total) by mouth 2 (two) times daily. One po bid x 7 days 02/24/15   Paula Libra, MD   BP 123/83 mmHg  Pulse 79  Temp(Src) 98.1 F (36.7 C) (Oral)  Resp 20  Ht 6' (1.829 m)  Wt 104.327 kg  BMI 31.19 kg/m2  SpO2 100% Physical Exam  Constitutional: He appears well-developed and well-nourished.  HENT:  Head: Normocephalic and atraumatic.  Neck:  Normal range of motion. Neck supple.  Cardiovascular: Normal rate, regular rhythm and normal heart sounds.   Pulmonary/Chest: Effort normal and breath sounds normal.  Genitourinary: Testes normal and penis normal. Right testis shows no mass, no swelling and no tenderness. Right testis is descended. Left testis shows no mass, no swelling and no tenderness. Left testis is descended. Circumcised. No penile erythema. No discharge found.  Chaperone present No lesions or rash  Musculoskeletal: Normal range of motion.  Lymphadenopathy:       Right: No inguinal adenopathy present.       Left: No inguinal adenopathy present.  Neurological: He is alert.  Skin: Skin is warm and dry.  Psychiatric: He has a normal mood and affect.  Nursing note and vitals reviewed.   ED Course  Procedures (including critical care time) Labs Review Labs Reviewed  RPR  HIV ANTIBODY (ROUTINE TESTING)  GC/CHLAMYDIA PROBE AMP ([facility]) NOT AT [facility]    Imaging Review No results found. I have personally reviewed and evaluated these images and lab results as part of my medical decision-making.   EKG Interpretation None      MDM   Final diagnoses:  None    Patient presents today with greenish colored penile discharge x 1 month.  He reports having recent unprotected sex.  Normal GU exam in the ED today.  GC/Chlamydia pending.  HIV and RPR also pending.  Deu to the fact that the patient is having symptoms, he was  given prophylactic treatment with Azithromycin and Rocephin in the ED.  Stable for discharge.  Return precautions given.      Santiago GladHeather Saidi Santacroce, PA-C 04/15/15 1040  Lyndal Pulleyaniel Knott, MD 04/16/15 [phone]

## 2015-04-15 NOTE — ED Notes (Addendum)
Pt with pus discharge x 1 month, pain with urination

## 2015-04-16 LAB — RPR: RPR: NONREACTIVE

## 2015-04-16 LAB — GC/CHLAMYDIA PROBE AMP (~~LOC~~) NOT AT ARMC
Chlamydia: NEGATIVE
NEISSERIA GONORRHEA: POSITIVE — AB

## 2015-04-16 LAB — HIV ANTIBODY (ROUTINE TESTING W REFLEX): HIV Screen 4th Generation wRfx: NONREACTIVE

## 2015-04-17 ENCOUNTER — Telehealth (HOSPITAL_BASED_OUTPATIENT_CLINIC_OR_DEPARTMENT_OTHER): Payer: Self-pay | Admitting: Emergency Medicine

## 2015-05-01 ENCOUNTER — Telehealth: Payer: Self-pay | Admitting: *Deleted

## 2015-05-01 NOTE — ED Notes (Signed)
Post ED Visit - Positive Culture Follow-up: Unsuccessful Patient Follow-up  Culture assessed and recommendations reviewed by: []  Enzo BiNathan Batchelder, Pharm.D. []  Celedonio MiyamotoJeremy Frens, Pharm.D., BCPS []  Garvin FilaMike Maccia, Pharm.D. []  Georgina PillionElizabeth Martin, Pharm.D., BCPS []  PeakMinh Pham, VermontPharm.D., BCPS, AAHIVP []  Estella HuskMichelle Turner, Pharm.D., BCPS, AAHIVP []  Tennis Mustassie Stewart, Pharm.D. []  Sherle Poeob Vincent, [address] BoulevardPharm.D[address]  Positive gonorrhea culture  [x]  Patient discharged without antimicrobial prescription and treatment is now indicated []  Organism is resistant to prescribed ED discharge antimicrobial []  Patient with positive blood cultures   Unable to contact by phone, letter returned, unable to forward  Lysle PearlRobertson, Grayton Lobo Talley 05/01/2015, 2:59 PM

## 2015-07-29 ENCOUNTER — Telehealth: Payer: Self-pay | Admitting: *Deleted

## 2015-07-29 NOTE — Telephone Encounter (Signed)
(+)  GC 04/15/2015, no response to phone or letter, unable to notify of (+) results

## 2018-04-09 ENCOUNTER — Emergency Department (HOSPITAL_COMMUNITY)
Admission: EM | Admit: 2018-04-09 | Discharge: 2018-04-09 | Disposition: A | Discharge status: Home / Self Care | Payer: No Typology Code available for payment source | Attending: Emergency Medicine | Admitting: Emergency Medicine

## 2018-04-09 ENCOUNTER — Other Ambulatory Visit: Payer: Self-pay

## 2018-04-09 ENCOUNTER — Encounter (HOSPITAL_COMMUNITY): Payer: Self-pay | Admitting: Emergency Medicine

## 2018-04-09 ENCOUNTER — Emergency Department (HOSPITAL_COMMUNITY): Payer: No Typology Code available for payment source

## 2018-04-09 DIAGNOSIS — F17210 Nicotine dependence, cigarettes, uncomplicated: Secondary | ICD-10-CM | POA: Diagnosis not present

## 2018-04-09 DIAGNOSIS — R51 Headache: Secondary | ICD-10-CM | POA: Diagnosis present

## 2018-04-09 DIAGNOSIS — Y92410 Unspecified street and highway as the place of occurrence of the external cause: Secondary | ICD-10-CM | POA: Insufficient documentation

## 2018-04-09 DIAGNOSIS — Y939 Activity, unspecified: Secondary | ICD-10-CM | POA: Diagnosis not present

## 2018-04-09 DIAGNOSIS — Y998 Other external cause status: Secondary | ICD-10-CM | POA: Insufficient documentation

## 2018-04-09 DIAGNOSIS — G44319 Acute post-traumatic headache, not intractable: Secondary | ICD-10-CM | POA: Insufficient documentation

## 2018-04-09 DIAGNOSIS — R0789 Other chest pain: Secondary | ICD-10-CM | POA: Diagnosis not present

## 2018-04-09 HISTORY — DX: Other chronic pain: G89.29

## 2018-04-09 HISTORY — DX: Dorsalgia, unspecified: M54.9

## 2018-04-09 MED ORDER — ACETAMINOPHEN 500 MG PO TABS
1000.0000 mg | ORAL_TABLET | Freq: Once | ORAL | Status: AC
  Administered 2018-04-09: 1000 mg via ORAL
  Filled 2018-04-09: qty 2

## 2018-04-09 MED ORDER — NAPROXEN 500 MG PO TABS: 500.0000 mg | ORAL_TABLET | Freq: Two times a day (BID) | ORAL | 0 refills | Status: DC

## 2018-04-09 MED ORDER — METHOCARBAMOL 500 MG PO TABS: 500.0000 mg | ORAL_TABLET | Freq: Every evening | ORAL | 0 refills | Status: DC | PRN
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
## 2018-04-09 NOTE — Discharge Instructions (Addendum)
Take naproxen 2 times a day with meals.  Do not take other anti-inflammatories at the same time (Advil, Motrin, ibuprofen, Aleve). You may supplement with Tylenol if you need further pain control. Use robaxin as needed for muscle stiffness or soreness.  Have caution, this may make you tired or groggy.  Do not drive or operate heavy machinery while taking this medicine. Use ice packs or heating pads if this helps control your pain. Use muscle cream such as salonpas, icy hot, BenGay, Biofreeze to help with your pain. You will likely have continued muscle stiffness and soreness over the next couple days.  Follow-up with primary care in 1 week if your symptoms are not improving. Return to the emergency room if you develop vision changes, vomiting, slurred speech, numbness, loss of bowel or bladder control, or any new or worsening symptoms.
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
## 2018-05-01 ENCOUNTER — Encounter (HOSPITAL_COMMUNITY): Payer: Self-pay | Admitting: Oncology

## 2018-06-07 DIAGNOSIS — F209 Schizophrenia, unspecified: Secondary | ICD-10-CM

## 2018-06-07 DIAGNOSIS — F1220 Cannabis dependence, uncomplicated: Secondary | ICD-10-CM

## 2018-06-07 HISTORY — DX: Schizophrenia, unspecified: F20.9

## 2018-06-07 HISTORY — DX: Cannabis dependence, uncomplicated: F12.20

## 2019-08-21 ENCOUNTER — Encounter (HOSPITAL_BASED_OUTPATIENT_CLINIC_OR_DEPARTMENT_OTHER): Payer: Self-pay | Admitting: *Deleted

## 2019-08-21 ENCOUNTER — Inpatient Hospital Stay (HOSPITAL_BASED_OUTPATIENT_CLINIC_OR_DEPARTMENT_OTHER)
Admission: EM | Admit: 2019-08-21 | Discharge: 2019-08-26 | DRG: 558 | Disposition: A | Discharge status: Home / Self Care | Payer: Self-pay | Attending: Internal Medicine | Admitting: Internal Medicine

## 2019-08-21 ENCOUNTER — Emergency Department (HOSPITAL_BASED_OUTPATIENT_CLINIC_OR_DEPARTMENT_OTHER): Payer: Self-pay

## 2019-08-21 ENCOUNTER — Other Ambulatory Visit: Payer: Self-pay

## 2019-08-21 DIAGNOSIS — Z20822 Contact with and (suspected) exposure to covid-19: Secondary | ICD-10-CM | POA: Diagnosis present

## 2019-08-21 DIAGNOSIS — F17210 Nicotine dependence, cigarettes, uncomplicated: Secondary | ICD-10-CM | POA: Diagnosis present

## 2019-08-21 DIAGNOSIS — M7989 Other specified soft tissue disorders: Secondary | ICD-10-CM | POA: Diagnosis present

## 2019-08-21 DIAGNOSIS — R209 Unspecified disturbances of skin sensation: Secondary | ICD-10-CM

## 2019-08-21 DIAGNOSIS — R29898 Other symptoms and signs involving the musculoskeletal system: Secondary | ICD-10-CM

## 2019-08-21 DIAGNOSIS — M6282 Rhabdomyolysis: Principal | ICD-10-CM | POA: Diagnosis present

## 2019-08-21 DIAGNOSIS — F329 Major depressive disorder, single episode, unspecified: Secondary | ICD-10-CM | POA: Diagnosis present

## 2019-08-21 DIAGNOSIS — R319 Hematuria, unspecified: Secondary | ICD-10-CM | POA: Diagnosis present

## 2019-08-21 DIAGNOSIS — E871 Hypo-osmolality and hyponatremia: Secondary | ICD-10-CM | POA: Diagnosis present

## 2019-08-21 DIAGNOSIS — F1910 Other psychoactive substance abuse, uncomplicated: Secondary | ICD-10-CM

## 2019-08-21 DIAGNOSIS — M79A22 Nontraumatic compartment syndrome of left lower extremity: Secondary | ICD-10-CM | POA: Diagnosis present

## 2019-08-21 DIAGNOSIS — E876 Hypokalemia: Secondary | ICD-10-CM | POA: Diagnosis present

## 2019-08-21 DIAGNOSIS — R55 Syncope and collapse: Secondary | ICD-10-CM | POA: Diagnosis present

## 2019-08-21 DIAGNOSIS — M545 Low back pain, unspecified: Secondary | ICD-10-CM

## 2019-08-21 DIAGNOSIS — R748 Abnormal levels of other serum enzymes: Secondary | ICD-10-CM | POA: Diagnosis present

## 2019-08-21 DIAGNOSIS — G8929 Other chronic pain: Secondary | ICD-10-CM | POA: Diagnosis present

## 2019-08-21 DIAGNOSIS — M549 Dorsalgia, unspecified: Secondary | ICD-10-CM

## 2019-08-21 DIAGNOSIS — Z888 Allergy status to other drugs, medicaments and biological substances status: Secondary | ICD-10-CM

## 2019-08-21 DIAGNOSIS — T40601A Poisoning by unspecified narcotics, accidental (unintentional), initial encounter: Secondary | ICD-10-CM | POA: Diagnosis present

## 2019-08-21 DIAGNOSIS — Z880 Allergy status to penicillin: Secondary | ICD-10-CM

## 2019-08-21 HISTORY — DX: Other symptoms and signs involving the musculoskeletal system: R29.898

## 2019-08-21 HISTORY — DX: Depression, unspecified: F32.A

## 2019-08-21 HISTORY — DX: Unspecified disturbances of skin sensation: R20.9

## 2019-08-21 HISTORY — DX: Rhabdomyolysis: M62.82

## 2019-08-21 HISTORY — DX: Other psychoactive substance abuse, uncomplicated: F19.10

## 2019-08-21 LAB — COMPREHENSIVE METABOLIC PANEL
ALT: 270 U/L — ABNORMAL HIGH (ref 0–44)
AST: 1424 U/L — ABNORMAL HIGH (ref 15–41)
Albumin: 4.1 g/dL (ref 3.5–5.0)
Alkaline Phosphatase: 82 U/L (ref 38–126)
Anion gap: 11 (ref 5–15)
BUN: 14 mg/dL (ref 6–20)
CO2: 23 mmol/L (ref 22–32)
Calcium: 8.1 mg/dL — ABNORMAL LOW (ref 8.9–10.3)
Chloride: 95 mmol/L — ABNORMAL LOW (ref 98–111)
Creatinine, Ser: 1.04 mg/dL (ref 0.61–1.24)
GFR calc Af Amer: >60 mL/min (ref >60)
GFR calc non Af Amer: >60 mL/min (ref >60)
Glucose, Bld: 94 mg/dL (ref 70–99)
Potassium: 3.3 mmol/L — ABNORMAL LOW (ref 3.5–5.1)
Sodium: 129 mmol/L — ABNORMAL LOW (ref 135–145)
Total Bilirubin: 0.7 mg/dL (ref 0.3–1.2)
Total Protein: 7.9 g/dL (ref 6.5–8.1)

## 2019-08-21 LAB — CBC WITH DIFFERENTIAL/PLATELET
Abs Immature Granulocytes: 0.06 10*3/uL (ref 0.00–0.07)
Basophils Absolute: 0 10*3/uL (ref 0.0–0.1)
Basophils Relative: 0 %
Eosinophils Absolute: 0 10*3/uL (ref 0.0–0.5)
Eosinophils Relative: 0 %
HCT: 45.1 % (ref 39.0–52.0)
Hemoglobin: 14.2 g/dL (ref 13.0–17.0)
Immature Granulocytes: 1 %
Lymphocytes Relative: 13 %
Lymphs Abs: 1.5 10*3/uL (ref 0.7–4.0)
MCH: 27.3 pg (ref 26.0–34.0)
MCHC: 31.5 g/dL (ref 30.0–36.0)
MCV: 86.7 fL (ref 80.0–100.0)
Monocytes Absolute: 1.2 10*3/uL — ABNORMAL HIGH (ref 0.1–1.0)
Monocytes Relative: 11 %
Neutro Abs: 8.5 10*3/uL — ABNORMAL HIGH (ref 1.7–7.7)
Neutrophils Relative %: 75 %
Platelets: 322 10*3/uL (ref 150–400)
RBC: 5.2 MIL/uL (ref 4.22–5.81)
RDW: 13.2 % (ref 11.5–15.5)
WBC: 11.3 10*3/uL — ABNORMAL HIGH (ref 4.0–10.5)
nRBC: 0 % (ref 0.0–0.2)

## 2019-08-21 LAB — URINALYSIS, ROUTINE W REFLEX MICROSCOPIC
Specific Gravity, Urine: 1.02 (ref 1.005–1.030)
pH: 6 (ref 5.0–8.0)

## 2019-08-21 LAB — RAPID URINE DRUG SCREEN, HOSP PERFORMED
Amphetamines: POSITIVE — AB
Barbiturates: NOT DETECTED
Benzodiazepines: NOT DETECTED
Cocaine: NOT DETECTED
Opiates: NOT DETECTED
Tetrahydrocannabinol: NOT DETECTED

## 2019-08-21 LAB — URINALYSIS, MICROSCOPIC (REFLEX)

## 2019-08-21 LAB — CBG MONITORING, ED: Glucose-Capillary: 74 mg/dL (ref 70–99)

## 2019-08-21 LAB — CK: Total CK: >50000 U/L — ABNORMAL HIGH (ref 49–397)

## 2019-08-21 LAB — ETHANOL: Alcohol, Ethyl (B): <10 mg/dL (ref <10)

## 2019-08-21 LAB — SARS CORONAVIRUS 2 BY RT PCR (HOSPITAL ORDER, PERFORMED IN ~~LOC~~ HOSPITAL LAB): SARS Coronavirus 2: NEGATIVE

## 2019-08-21 MED ORDER — LACTATED RINGERS IV BOLUS
1000.0000 mL | Freq: Once | INTRAVENOUS | Status: AC
  Administered 2019-08-21: 1000 mL via INTRAVENOUS

## 2019-08-21 MED ORDER — SODIUM CHLORIDE 0.9 % IV BOLUS: 1000.0000 mL | Freq: Once | INTRAVENOUS | Status: DC

## 2019-08-21 MED ORDER — FENTANYL CITRATE (PF) 100 MCG/2ML IJ SOLN
100.0000 ug | Freq: Once | INTRAMUSCULAR | Status: AC
  Administered 2019-08-21: 100 ug via INTRAVENOUS
  Filled 2019-08-21 (×2): qty 2

## 2019-08-21 MED ORDER — DEXTROSE 5 % IV SOLN: Freq: Once | INTRAVENOUS | Status: AC

## 2019-08-21 MED ORDER — HYDROMORPHONE HCL 1 MG/ML IJ SOLN
1.0000 mg | INTRAMUSCULAR | Status: DC | PRN
  Administered 2019-08-21 (×3): 1 mg via INTRAVENOUS
  Filled 2019-08-21 (×4): qty 1

## 2019-08-21 MED ORDER — SODIUM CHLORIDE 0.9 % IV SOLN: Freq: Once | INTRAVENOUS | Status: AC

## 2019-08-21 MED ORDER — IOHEXOL 350 MG/ML SOLN
100.0000 mL | Freq: Once | INTRAVENOUS | Status: AC | PRN
  Administered 2019-08-21: 100 mL via INTRAVENOUS

## 2019-08-21 MED ORDER — IBUPROFEN 400 MG PO TABS
600.0000 mg | ORAL_TABLET | Freq: Once | ORAL | Status: AC
  Administered 2019-08-21: 600 mg via ORAL
  Filled 2019-08-21: qty 1

## 2019-08-21 MED ORDER — LACTATED RINGERS IV BOLUS: 1000.0000 mL | Freq: Once | INTRAVENOUS | Status: DC

## 2019-08-21 NOTE — ED Provider Notes (Addendum)
F/U CTA.Dispo per results and repeat exam. May need MRI. Physical Exam  BP (!) 134/103 (BP Location: Left Arm)   Pulse 90   Temp 98.4 F (36.9 C) (Oral)   Resp 16   Ht 6' (1.829 m)   Wt 117.9 kg   SpO2 99%   BMI 35.26 kg/m   Physical Exam Patient is alert with clear mental status.  No respiratory distress at rest.  Examination of bilateral lower extremities shows diffuse swelling of the left relative to the right.  Right foot is warm to the touch and there is a palpable 2+ dorsalis pedis pulse.  Left foot is cool with toes dusky in appearance.  I cannot obtain pulse to palpation but can get a clearly audible DP pulse with hand-held Doppler. ED Course/Procedures     Procedures CRITICAL CARE Performed by: Arby Barrette   Total critical care time: 60 minutes  Critical care time was exclusive of separately billable procedures and treating other patients.  Critical care was necessary to treat or prevent imminent or life-threatening deterioration.  Critical care was time spent personally by me on the following activities: development of treatment plan with patient and/or surrogate as well as nursing, discussions with consultants, evaluation of patient's response to treatment, examination of patient, obtaining history from patient or surrogate, ordering and performing treatments and interventions, ordering and review of laboratory studies, ordering and review of radiographic studies, pulse oximetry and re-evaluation of patient's condition. MDM  Patient CK has returned greater than 50,000.  Patient's urine looks like dark tea.  Patient's mother provided additional history.  She reports that when he came home last night he laid in the floor on a hard cold surface.  He refused to get up or move.  He laid for many hours in one position on the floor.  Patient CT angio does show runoff and flow.  The left limb is cool and swollen.  Hand-held Doppler does confirm continued dorsalis pedis pulse.   Patient has been somewhat contentious and resistant to facilitating medical care.  I have counseled the patient and his mother on the seriousness of his condition and risk of kidney failure and loss of limb.  Patient has had 1 L of lactated Ringer's.  Will change fluids to normal saline with an additional liter bolus and then continue another liter over the next hour.  Will consult with nephrology and orthopedics with anticipate admission to medical service.  At this time, will continue fluid resuscitation and transfer to [facility] for subspecialty services of nephrology and orthopedics.   Recheck at 17:00 foot appears slightly improved on the left.  Less cool to touch and less dusky.  Patient continues to have a lot of pain in his low back and buttocks.  Will add Dilaudid.  Consult: 17: 10 [facility] Dr. Aundria Rud.  At this time, with pulse present and patient in the process of fluid resuscitation from severe rhabdo will need ongoing medical care.  They will follow along to determine if any interventional treatment is indicated.  Reconsult once patient is admitted at [facility].  Consult: Reviewed with Dr. Stevie Kern except for ED to ED transfer.  Consult: 18: 40 reviewed with Dr. Hyman Hopes nephrology.  At this time feels that 3 L of fluid resuscitation adequate and can now transition to D5W with 3 A bicarb was 100 cc an hour overnight.  Recheck 18: 57 left foot has Doppler pulse no decrease in pulse.  Foot still somewhat cool but less so than  on first exam.  Patient is having a lot of pain in the leg and thigh.  He has numbness.  The status is clear.  Blood pressure heart rate are stable.  Respiratory status is stable.  Anticipate ED to ED transfer.    Arby Barrette, MD 08/21/19 1645    Arby Barrette, MD 08/21/19 1705    Arby Barrette, MD 08/21/19 1711    Arby Barrette, MD 08/21/19 [phone]

## 2019-08-21 NOTE — ED Notes (Signed)
Ultrasound at bedside at this time.

## 2019-08-21 NOTE — ED Provider Notes (Signed)
[facility] EMERGENCY DEPARTMENT Provider Note   CSN: [id number] Arrival date & time: 08/21/19  1253     History Chief Complaint  Patient presents with  . Hematuria  . Back Pain    Andrew [name] is a 29 y.o. male.  HPI 29 year old male presents with back pain and left leg swelling.  Patient is an overall poor historian.  He states that since around 1 AM he has had severe left-sided low back pain as well as his left leg is swollen and entirely numb.  His right lateral thigh is also numb but no pain or swelling.  It is hard to move his left leg.  He does endorse using ecstasy last night as well as four locos.  He denies any obvious injuries.  He has urinated once and states it looked like blood.  When further asked to describe it he states it was tea colored.   Past Medical History:  Diagnosis Date  . Chronic back pain     There are no problems to display for this patient.   History reviewed. No pertinent surgical history.     No family history on file.  Social History   Tobacco Use  . Smoking status: Current Every Day Smoker    Packs/day: 1.00    Types: Cigarettes  . Smokeless tobacco: Never Used  Substance Use Topics  . Alcohol use: Yes    Comment: occassional  . Drug use: Yes    Types: Marijuana    Comment: occassional    Home Medications Prior to Admission medications   Medication Sig Start Date End Date Taking? Authorizing Provider  mirtazapine (REMERON SOL-TAB) 15 MG disintegrating tablet Take 15 mg by mouth at bedtime as needed.   Yes [provider]    Allergies    Haloperidol lactate and Penicillins  Review of Systems   Review of Systems  Cardiovascular: Positive for leg swelling.  Gastrointestinal: Negative for abdominal pain and vomiting.  Musculoskeletal: Positive for back pain.  Neurological: Positive for numbness.  All other systems reviewed and are negative.   Physical Exam Updated Vital Signs BP (!) 134/103 (BP  Location: Left Arm)   Pulse 90   Temp 98.4 F (36.9 C) (Oral)   Resp 16   Ht 6' (1.829 m)   Wt 117.9 kg   SpO2 99%   BMI 35.26 kg/m   Physical Exam Vitals and nursing note reviewed.  Constitutional:      Appearance: He is well-developed. He is obese. He is diaphoretic.  HENT:     Head: Normocephalic and atraumatic.     Right Ear: External ear normal.     Left Ear: External ear normal.     Nose: Nose normal.  Eyes:     General:        Right eye: No discharge.        Left eye: No discharge.  Cardiovascular:     Rate and Rhythm: Normal rate and regular rhythm.     Pulses:          Dorsalis pedis pulses are 2+ on the right side and 1+ on the left side.     Heart sounds: Normal heart sounds.     Comments: Left DP is 1+, though may be due to swelling. Easily found with doppler Pulmonary:     Effort: Pulmonary effort is normal.     Breath sounds: Normal breath sounds.  Abdominal:     Palpations: Abdomen is soft.  Tenderness: There is no abdominal tenderness.  Musculoskeletal:     Cervical back: Neck supple.     Thoracic back: No tenderness.     Lumbar back: Tenderness (left lateral) present. No bony tenderness.     Comments: Left leg is diffusely swollen. It is cool compared to right.  Skin:    General: Skin is warm.  Neurological:     Mental Status: He is alert.  Psychiatric:        Mood and Affect: Mood is not anxious.     ED Results / Procedures / Treatments   Labs (all labs ordered are listed, but only abnormal results are displayed) Labs Reviewed  CBC WITH DIFFERENTIAL/PLATELET - Abnormal; Notable for the following components:      Result Value   WBC 11.3 (*)    Neutro Abs 8.5 (*)    Monocytes Absolute 1.2 (*)    All other components within normal limits  COMPREHENSIVE METABOLIC PANEL - Abnormal; Notable for the following components:   Sodium 129 (*)    Potassium 3.3 (*)    Chloride 95 (*)    Calcium 8.1 (*)    AST 1,424 (*)    ALT 270 (*)    All  other components within normal limits  RAPID URINE DRUG SCREEN, HOSP PERFORMED  URINALYSIS, ROUTINE W REFLEX MICROSCOPIC  CK  ETHANOL  CBG MONITORING, ED    EKG EKG Interpretation  Date/Time:  Tuesday August 21 2019 13:33:03 EDT Ventricular Rate:  84 PR Interval:    QRS Duration: 92 QT Interval:  382 QTC Calculation: 452 R Axis:   60 Text Interpretation: Sinus rhythm no acute ST/T changes No old tracing to compare Confirmed by Pricilla Loveless [phone]) on 08/21/2019 1:46:30 PM   Radiology US Venous Img Lower Unilateral Left (DVT)  Result Date: 08/21/2019 CLINICAL DATA:  Left leg swelling EXAM: LEFT LOWER EXTREMITY VENOUS DOPPLER ULTRASOUND TECHNIQUE: Gray-scale sonography with compression, as well as color and duplex ultrasound, were performed to evaluate the deep venous system(s) from the level of the common femoral vein through the popliteal and proximal calf veins. COMPARISON:  None. FINDINGS: VENOUS Normal compressibility of the common femoral, superficial femoral, and popliteal veins, as well as the visualized calf veins. Visualized portions of profunda femoral vein and great saphenous vein unremarkable. No filling defects to suggest DVT on grayscale or color Doppler imaging. Doppler waveforms show normal direction of venous flow, normal respiratory plasticity and response to augmentation. Limited views of the contralateral common femoral vein are unremarkable. OTHER None. Limitations: none IMPRESSION: Negative. Electronically Signed   By: Katherine Mantle M.D.   On: 08/21/2019 15:07    Procedures Procedures (including critical care time)  Medications Ordered in ED Medications  lactated ringers bolus 1,000 mL (has no administration in time range)  iohexol (OMNIPAQUE) 350 MG/ML injection 100 mL (has no administration in time range)  lactated ringers bolus 1,000 mL (1,000 mLs Intravenous New Bag/Given 08/21/19 1356)  fentaNYL (SUBLIMAZE) injection 100 mcg (100 mcg Intravenous  Given 08/21/19 1449)  ibuprofen (ADVIL) tablet 600 mg (600 mg Oral Given 08/21/19 1404)    ED Course  I have reviewed the triage vital signs and the nursing notes.  Pertinent labs & imaging results that were available during my care of the patient were reviewed by me and considered in my medical decision making (see chart for details).    MDM Rules/Calculators/A&P  Patient was slightly hypotensive on arrival though this improved without treatment.  Of note, he can move his left lower extremity but minimally, I think a lot of that is due to pain.  It is significantly and diffusely swollen compared to the right but there is no apparent DVT on ultrasound.  While he does have a pulse in that extremity, unclear will be causing his back pain and leg swelling so a CT angiography will be ordered.  If this is negative, he will probably need MRI given the diffuse numbness in his leg.  He has acute LFT abnormalities but otherwise his labs are fairly benign.  CK is currently pending as well as a urinalysis. Care to Dr. Donnald Garre.  Final Clinical Impression(s) / ED Diagnoses Final diagnoses:  None    Rx / DC Orders ED Discharge Orders    None       Pricilla Loveless, MD 08/21/19 1515

## 2019-08-21 NOTE — ED Triage Notes (Signed)
Swelling and numbness in his left leg since last night. Right upper leg is numb. Hematuria this am. He is diaphoretic.

## 2019-08-21 NOTE — ED Notes (Signed)
ED Provider at bedside. 

## 2019-08-22 ENCOUNTER — Inpatient Hospital Stay (HOSPITAL_COMMUNITY): Payer: Self-pay

## 2019-08-22 ENCOUNTER — Encounter (HOSPITAL_COMMUNITY): Payer: Self-pay | Admitting: Internal Medicine

## 2019-08-22 DIAGNOSIS — R55 Syncope and collapse: Secondary | ICD-10-CM

## 2019-08-22 DIAGNOSIS — F1910 Other psychoactive substance abuse, uncomplicated: Secondary | ICD-10-CM

## 2019-08-22 DIAGNOSIS — M6282 Rhabdomyolysis: Principal | ICD-10-CM

## 2019-08-22 DIAGNOSIS — R29898 Other symptoms and signs involving the musculoskeletal system: Secondary | ICD-10-CM

## 2019-08-22 LAB — ECHOCARDIOGRAM COMPLETE
Area-P 1/2: 3.19 cm2
Calc EF: 50.7 %
Height: 72 in
S' Lateral: 3 cm
Single Plane A2C EF: 51.4 %
Single Plane A4C EF: 50.4 %
Weight: 4160 oz

## 2019-08-22 LAB — COMPREHENSIVE METABOLIC PANEL
ALT: 264 U/L — ABNORMAL HIGH (ref 0–44)
AST: 964 U/L — ABNORMAL HIGH (ref 15–41)
Albumin: 2.8 g/dL — ABNORMAL LOW (ref 3.5–5.0)
Alkaline Phosphatase: 64 U/L (ref 38–126)
Anion gap: 9 (ref 5–15)
BUN: 9 mg/dL (ref 6–20)
CO2: 28 mmol/L (ref 22–32)
Calcium: 8.2 mg/dL — ABNORMAL LOW (ref 8.9–10.3)
Chloride: 98 mmol/L (ref 98–111)
Creatinine, Ser: 0.91 mg/dL (ref 0.61–1.24)
GFR calc Af Amer: >60 mL/min (ref >60)
GFR calc non Af Amer: >60 mL/min (ref >60)
Glucose, Bld: 110 mg/dL — ABNORMAL HIGH (ref 70–99)
Potassium: 3.3 mmol/L — ABNORMAL LOW (ref 3.5–5.1)
Sodium: 135 mmol/L (ref 135–145)
Total Bilirubin: 0.4 mg/dL (ref 0.3–1.2)
Total Protein: 6.1 g/dL — ABNORMAL LOW (ref 6.5–8.1)

## 2019-08-22 LAB — CBC WITH DIFFERENTIAL/PLATELET
Abs Immature Granulocytes: 0.04 10*3/uL (ref 0.00–0.07)
Basophils Absolute: 0 10*3/uL (ref 0.0–0.1)
Basophils Relative: 0 %
Eosinophils Absolute: 0 10*3/uL (ref 0.0–0.5)
Eosinophils Relative: 1 %
HCT: 41.5 % (ref 39.0–52.0)
Hemoglobin: 13 g/dL (ref 13.0–17.0)
Immature Granulocytes: 1 %
Lymphocytes Relative: 16 %
Lymphs Abs: 1.4 10*3/uL (ref 0.7–4.0)
MCH: 26.8 pg (ref 26.0–34.0)
MCHC: 31.3 g/dL (ref 30.0–36.0)
MCV: 85.6 fL (ref 80.0–100.0)
Monocytes Absolute: 1.1 10*3/uL — ABNORMAL HIGH (ref 0.1–1.0)
Monocytes Relative: 12 %
Neutro Abs: 6.2 10*3/uL (ref 1.7–7.7)
Neutrophils Relative %: 70 %
Platelets: 274 10*3/uL (ref 150–400)
RBC: 4.85 MIL/uL (ref 4.22–5.81)
RDW: 12.9 % (ref 11.5–15.5)
WBC: 8.7 10*3/uL (ref 4.0–10.5)
nRBC: 0 % (ref 0.0–0.2)

## 2019-08-22 LAB — HEPATIC FUNCTION PANEL
ALT: 262 U/L — ABNORMAL HIGH (ref 0–44)
AST: 959 U/L — ABNORMAL HIGH (ref 15–41)
Albumin: 2.7 g/dL — ABNORMAL LOW (ref 3.5–5.0)
Alkaline Phosphatase: 66 U/L (ref 38–126)
Bilirubin, Direct: 0.1 mg/dL (ref 0.0–0.2)
Indirect Bilirubin: 0.4 mg/dL (ref 0.3–0.9)
Total Bilirubin: 0.5 mg/dL (ref 0.3–1.2)
Total Protein: 6.2 g/dL — ABNORMAL LOW (ref 6.5–8.1)

## 2019-08-22 LAB — CK: Total CK: >50000 U/L — ABNORMAL HIGH (ref 49–397)

## 2019-08-22 LAB — HEPATITIS PANEL, ACUTE
HCV Ab: NONREACTIVE
Hep A IgM: NONREACTIVE
Hep B C IgM: NONREACTIVE
Hepatitis B Surface Ag: NONREACTIVE

## 2019-08-22 LAB — TROPONIN I (HIGH SENSITIVITY): Troponin I (High Sensitivity): 39 ng/L — ABNORMAL HIGH (ref <18)

## 2019-08-22 LAB — MAGNESIUM: Magnesium: 2 mg/dL (ref 1.7–2.4)

## 2019-08-22 LAB — PHOSPHORUS: Phosphorus: 3.3 mg/dL (ref 2.5–4.6)

## 2019-08-22 LAB — TSH: TSH: 0.336 u[IU]/mL — ABNORMAL LOW (ref 0.350–4.500)

## 2019-08-22 LAB — HIV ANTIBODY (ROUTINE TESTING W REFLEX): HIV Screen 4th Generation wRfx: NONREACTIVE

## 2019-08-22 LAB — ACETAMINOPHEN LEVEL: Acetaminophen (Tylenol), Serum: <10 ug/mL — ABNORMAL LOW (ref 10–30)

## 2019-08-22 MED ORDER — LORAZEPAM 2 MG/ML IJ SOLN: 0.0000 mg | Freq: Two times a day (BID) | INTRAMUSCULAR | Status: AC

## 2019-08-22 MED ORDER — SODIUM BICARBONATE-DEXTROSE 150-5 MEQ/L-% IV SOLN
150.0000 meq | INTRAVENOUS | Status: DC
  Administered 2019-08-22 – 2019-08-23 (×5): 150 meq via INTRAVENOUS
  Filled 2019-08-22 (×6): qty 1000

## 2019-08-22 MED ORDER — ONDANSETRON HCL 4 MG PO TABS: 4.0000 mg | ORAL_TABLET | Freq: Four times a day (QID) | ORAL | Status: DC | PRN

## 2019-08-22 MED ORDER — FOLIC ACID 1 MG PO TABS
1.0000 mg | ORAL_TABLET | Freq: Every day | ORAL | Status: DC
  Administered 2019-08-22 – 2019-08-26 (×5): 1 mg via ORAL
  Filled 2019-08-22 (×5): qty 1

## 2019-08-22 MED ORDER — THIAMINE HCL 100 MG/ML IJ SOLN
100.0000 mg | Freq: Every day | INTRAMUSCULAR | Status: DC
  Administered 2019-08-22: 100 mg via INTRAVENOUS
  Filled 2019-08-22: qty 2

## 2019-08-22 MED ORDER — THIAMINE HCL 100 MG PO TABS
100.0000 mg | ORAL_TABLET | Freq: Every day | ORAL | Status: DC
  Administered 2019-08-23 – 2019-08-26 (×4): 100 mg via ORAL
  Filled 2019-08-22 (×5): qty 1

## 2019-08-22 MED ORDER — LORAZEPAM 2 MG/ML IJ SOLN: 1.0000 mg | INTRAMUSCULAR | Status: AC | PRN

## 2019-08-22 MED ORDER — ADULT MULTIVITAMIN W/MINERALS CH
1.0000 | ORAL_TABLET | Freq: Every day | ORAL | Status: DC
  Administered 2019-08-22 – 2019-08-26 (×5): 1 via ORAL
  Filled 2019-08-22 (×5): qty 1

## 2019-08-22 MED ORDER — SODIUM BICARBONATE 8.4 % IV SOLN: INTRAVENOUS | Status: DC

## 2019-08-22 MED ORDER — HYDROMORPHONE HCL 1 MG/ML IJ SOLN
1.0000 mg | INTRAMUSCULAR | Status: DC | PRN
  Administered 2019-08-22 – 2019-08-23 (×10): 1 mg via INTRAVENOUS
  Filled 2019-08-22 (×9): qty 1

## 2019-08-22 MED ORDER — LORAZEPAM 1 MG PO TABS: 1.0000 mg | ORAL_TABLET | ORAL | Status: AC | PRN

## 2019-08-22 MED ORDER — OXYCODONE-ACETAMINOPHEN 5-325 MG PO TABS
1.0000 | ORAL_TABLET | ORAL | Status: DC | PRN
  Administered 2019-08-22 – 2019-08-26 (×15): 2 via ORAL
  Filled 2019-08-22 (×15): qty 2

## 2019-08-22 MED ORDER — POTASSIUM CHLORIDE CRYS ER 20 MEQ PO TBCR
40.0000 meq | EXTENDED_RELEASE_TABLET | Freq: Once | ORAL | Status: AC
  Administered 2019-08-22: 40 meq via ORAL
  Filled 2019-08-22: qty 2

## 2019-08-22 MED ORDER — LORAZEPAM 2 MG/ML IJ SOLN
0.0000 mg | Freq: Four times a day (QID) | INTRAMUSCULAR | Status: AC
  Administered 2019-08-22 (×2): 2 mg via INTRAVENOUS
  Filled 2019-08-22 (×2): qty 1

## 2019-08-22 MED ORDER — ONDANSETRON HCL 4 MG/2ML IJ SOLN: 4.0000 mg | Freq: Four times a day (QID) | INTRAMUSCULAR | Status: DC | PRN

## 2019-08-22 NOTE — Progress Notes (Signed)
Patient seen and admitted by Dr. Toniann Fail earlier this morning please see his detailed H&P,. 29 year old gentleman presented to ED for left lower extremity swelling and pain was found to have rhabdomyolysis with subacute compartment syndrome of the left lower leg probably secondary to narcotic overdose versus pressure ischemia.  Orthopedics consulted and recommended no surgical intervention at this time and to continue with neurovascular checks with Doppler.  Patient seen and examined at bedside Continue with pain medication and neurovascular checks.   Kathlen Mody MD

## 2019-08-22 NOTE — Progress Notes (Signed)
Patient off floor for Echo.

## 2019-08-22 NOTE — Progress Notes (Signed)
Patient getting Dilaudid therefore holding the Ativan for reasons of not wanting to overdose him and or have his respirations decrease as he goes right to sleep after the dilaudid

## 2019-08-22 NOTE — Plan of Care (Signed)
°  Problem: Education: Goal: Knowledge of General Education information will improve Description: Including pain rating scale, medication(s)/side effects and non-pharmacologic comfort measures Outcome: Not Progressing   Problem: Health Behavior/Discharge Planning: Goal: Ability to manage health-related needs will improve Outcome: Not Progressing   Problem: Clinical Measurements: Goal: Ability to maintain clinical measurements within normal limits will improve Outcome: Not Progressing Goal: Will remain free from infection Outcome: Not Progressing Goal: Diagnostic test results will improve Outcome: Not Progressing Goal: Respiratory complications will improve Outcome: Not Progressing Goal: Cardiovascular complication will be avoided Outcome: Not Progressing   Problem: Nutrition: Goal: Adequate nutrition will be maintained Outcome: Not Progressing   Problem: Activity: Goal: Risk for activity intolerance will decrease Outcome: Not Progressing   Problem: Coping: Goal: Level of anxiety will decrease Outcome: Not Progressing   Problem: Elimination: Goal: Will not experience complications related to bowel motility Outcome: Not Progressing Goal: Will not experience complications related to urinary retention Outcome: Not Progressing   Problem: Safety: Goal: Ability to remain free from injury will improve Outcome: Not Progressing   Problem: Skin Integrity: Goal: Risk for impaired skin integrity will decrease Outcome: Not Progressing   

## 2019-08-22 NOTE — Consult Note (Signed)
ORTHOPAEDIC CONSULTATION  REQUESTING PHYSICIAN: Eduard Clos, MD  Chief Complaint: left leg swelling and low back pain  HPI: Andrew [name] is a 29 y.o. male who complains of left leg pain and swelling. He states that yesterday he drank a few four lokos as well as took some ectasy pills. He then passed out and woke up at 1 am with left leg swelling. He does not know how long he was passed out or when he passed out. After he woke, he urinated once and states that it was tea colored. He presented to the hospital at 12:53 pm due to the left leg pain and swelling. Complaining of low back pain and buttock pain.   Past Medical History:  Diagnosis Date  . Chronic back pain   . Depression    History reviewed. No pertinent surgical history. Social History   Socioeconomic History  . Marital status: Single    Spouse name: Not on file  . Number of children: Not on file  . Years of education: Not on file  . Highest education level: Not on file  Occupational History  . Not on file  Tobacco Use  . Smoking status: Current Every Day Smoker    Packs/day: 1.00    Types: Cigarettes  . Smokeless tobacco: Never Used  Substance and Sexual Activity  . Alcohol use: Yes    Comment: occassional  . Drug use: Yes    Types: Marijuana    Comment: occassional  . Sexual activity: Yes  Other Topics Concern  . Not on file  Social History Narrative   ** Merged History Encounter **       Social Determinants of Health   Financial Resource Strain:   . Difficulty of Paying Living Expenses:   Food Insecurity:   . Worried About Programme researcher, broadcasting/film/video in the Last Year:   . Barista in the Last Year:   Transportation Needs:   . Freight forwarder (Medical):   Marland Kitchen Lack of Transportation (Non-Medical):   Physical Activity:   . Days of Exercise per Week:   . Minutes of Exercise per Session:   Stress:   . Feeling of Stress :   Social Connections:   . Frequency of Communication with  Friends and Family:   . Frequency of Social Gatherings with Friends and Family:   . Attends Religious Services:   . Active Member of Clubs or Organizations:   . Attends Banker Meetings:   Marland Kitchen Marital Status:    Family History  Family history unknown: Yes   Allergies  Allergen Reactions  . Haloperidol Lactate Other (See Comments)  . Penicillins Hives     Positive ROS: All other systems have been reviewed and were otherwise negative with the exception of those mentioned in the HPI and as above.  Physical Exam: BP (!) 141/108   Pulse 90   Temp 98 F (36.7 C) (Oral)   Resp 18   Ht 6' (1.829 m)   Wt 117.9 kg   SpO2 99%   BMI 35.26 kg/m   General: Alert, no acute distress. Cardiovascular: I cannot palpate pulses in the left lower extremity, he has an intact right lower extremity dorsalis pedis pulse that is easily palpable.  The left lower extremity does have intact dopplerable pulses in both the posterior tibial distribution as well as the dorsalis pedis distribution.  His toes are cool to the touch. Respiratory: No cyanosis, no use of accessory musculature GI: No  organomegaly, abdomen is soft and non-tender Skin: No lesions noted at either lower extremity. Neurologic: See below. Psychiatric: Patient is competent for consent with normal mood and affect Lymphatic: No axillary or cervical lymphadenopathy  MUSCULOSKELETAL:  LLE - left buttock and thigh are soft. Left lower leg with significant swelling, compartments not compressible. Faint DP pulse found with doppler, faint PT pulse found with doppler.  He has no motion at either the toes, or the ankle, he has 30 degrees of motion at the knee.  His thigh compartments are compressible, and his buttock compartment also feels compressible.  He has complete absence of sensation throughout his toes, he reports being able to feel the plantar aspect, but I do not think that he is being forthright with the exam.  When I asked him  to close his eyes, he is not keeping him closed through the exam.      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  I do not believe that surgical intervention is going to change the course of his ultimate outcome, and the best case scenario would be to be able to avoid infection, salvage his kidneys, and hopefully achieve some degree of return of function in his left lower extremity through reenervation and possible muscular revascularization.  I had a conference with Dr. Duwayne Heck, who was initially consulted on the case when the patient was at [facility], who concurred that surgical intervention for subacute compartment syndrome would not be effective, or beneficial, and recommended conservative care.  He has greater than 50% chance of losing the leg, no matter what the action taken currently, we will continue to monitor his vascular status as well as the symptoms he has in his thigh and buttocks.  I have encouraged him, and assisted him to take pressure off of that side, moving within the bed, as well as getting extra pillows to optimize elevation, I do not believe that he has a compartment syndrome of his thigh or buttocks, and did not feel that Stryker compartment measurements were indicated.  All of those compartments were clinically palpable and  soft.  Andrew Lucy, MD  08/22/2019 1:35 AM

## 2019-08-22 NOTE — H&P (Signed)
History and Physical    Lamari Beckles JKK:[id number] DOB: 1990-04-05 DOA: 08/21/2019  PCP: Patient, No Pcp Per  Patient coming from: Home.  Chief Complaint: Left lower extremity pain and swelling.  HPI: Antoine Vandermeulen is a 29 y.o. male with history of depression and polysubstance abuse who is just out of the jail about a month ago and has been drinking alcohol every day admits to taking ecstasy a day before coming to the ER had a syncopal episode at home following which patient started noticing he was weakness both lower extremities when he woke up.  He does not remember how long he was on the floor.  His right lower extremity gained strength soon but his left lower extremity was getting more progressively swollen and weak and decreased strength.  He came to the ER.  ED Course: In the ER it was noticed that patient's left lower extremity was swollen cold to touch but had pulses which were dopplerable.  Ultrasound of the lower extremity was negative for DVT and CT angiogram of the lower extremity showed features concerning for enlargement and hypoenhancement of the lower extremity muscles on the left side concerning for rhabdomyolysis versus pyomyositis versus necrotizing fasciitis among other differentials.  Patient CK level was more than 50,000 AST was 1424 ALT 270 sodium 129 potassium 3.3 and WBC count 11.3.  Urine drug screen is positive for amphetamine.  Patient was given normal saline bolus 3 L following which nephrology recommended starting patient on bicarb drip.  Orthopedic was consulted for the left lower extremity swelling concerning for possible compartment syndrome.  Review of Systems: As per HPI, rest all negative.   Past Medical History:  Diagnosis Date  . Chronic back pain   . Depression     History reviewed. No pertinent surgical history.   reports that he has been smoking cigarettes. He has been smoking about 1.00 pack per day. He has never used smokeless tobacco. He  reports current alcohol use. He reports current drug use. Drug: Marijuana.  Allergies  Allergen Reactions  . Haloperidol Lactate Other (See Comments)  . Penicillins Hives    Family History  Family history unknown: Yes    Prior to Admission medications   Medication Sig Start Date End Date Taking? Authorizing Provider  mirtazapine (REMERON SOL-TAB) 15 MG disintegrating tablet Take 15 mg by mouth at bedtime as needed.   Yes [provider]    Physical Exam: Constitutional: Moderately built and nourished. Vitals:   08/21/19 1817 08/21/19 1941 08/21/19 2112 08/21/19 2333  BP: (!) 140/96 130/81 (!) 147/93 (!) 135/97  Pulse: 78 78 68 81  Resp: 19 17 18 18   Temp:   98 F (36.7 C) 98 F (36.7 C)  TempSrc:   Oral Oral  SpO2: 100% 100% 97% 99%  Weight:      Height:       Eyes: Anicteric no pallor. ENMT: No discharge from the ears eyes nose or mouth. Neck: No mass felt.  No neck rigidity. Respiratory: No rhonchi or crepitations. Cardiovascular: S1-S2 heard. Abdomen: Soft nontender bowel sounds present. Musculoskeletal: Left lower extremity swollen from the thigh downwards with intact sensation and pulses.  Cold to touch. Skin: No rash. Neurologic: Alert awake oriented to time place and person.  Unable to move his left lower extremity.  Rest of the extremities he is able to move.  Has good sensation in the left lower extremity. Psychiatric: Appears normal.  Normal affect.   Labs on Admission: I have personally reviewed  following labs and imaging studies  CBC: Recent Labs  Lab 08/21/19 1338  WBC 11.3*  NEUTROABS 8.5*  HGB 14.2  HCT 45.1  MCV 86.7  PLT 322   Basic Metabolic Panel: Recent Labs  Lab 08/21/19 1338  NA 129*  K 3.3*  CL 95*  CO2 23  GLUCOSE 94  BUN 14  CREATININE 1.04  CALCIUM 8.1*   GFR: Estimated Creatinine Clearance: 140.1 mL/min (by C-G formula based on SCr of 1.04 mg/dL). Liver Function Tests: Recent Labs  Lab 08/21/19 1338  AST  1,424*  ALT 270*  ALKPHOS 82  BILITOT 0.7  PROT 7.9  ALBUMIN 4.1   No results for input(s): LIPASE, AMYLASE in the last 168 hours. No results for input(s): AMMONIA in the last 168 hours. Coagulation Profile: No results for input(s): INR, PROTIME in the last 168 hours. Cardiac Enzymes: Recent Labs  Lab 08/21/19 1338  CKTOTAL >50,000*   BNP (last 3 results) No results for input(s): PROBNP in the last 8760 hours. HbA1C: No results for input(s): HGBA1C in the last 72 hours. CBG: Recent Labs  Lab 08/21/19 1323  GLUCAP 74   Lipid Profile: No results for input(s): CHOL, HDL, LDLCALC, TRIG, CHOLHDL, LDLDIRECT in the last 72 hours. Thyroid Function Tests: No results for input(s): TSH, T4TOTAL, FREET4, T3FREE, THYROIDAB in the last 72 hours. Anemia Panel: No results for input(s): VITAMINB12, FOLATE, FERRITIN, TIBC, IRON, RETICCTPCT in the last 72 hours. Urine analysis:    Component Value Date/Time   COLORURINE BROWN (A) 08/21/2019 1504   APPEARANCEUR HAZY (A) 08/21/2019 1504   LABSPEC 1.020 08/21/2019 1504   PHURINE 6.0 08/21/2019 1504   GLUCOSEU (A) 08/21/2019 1504    TEST NOT REPORTED DUE TO COLOR INTERFERENCE OF URINE PIGMENT   HGBUR (A) 08/21/2019 1504    TEST NOT REPORTED DUE TO COLOR INTERFERENCE OF URINE PIGMENT   BILIRUBINUR (A) 08/21/2019 1504    TEST NOT REPORTED DUE TO COLOR INTERFERENCE OF URINE PIGMENT   KETONESUR (A) 08/21/2019 1504    TEST NOT REPORTED DUE TO COLOR INTERFERENCE OF URINE PIGMENT   PROTEINUR (A) 08/21/2019 1504    TEST NOT REPORTED DUE TO COLOR INTERFERENCE OF URINE PIGMENT   UROBILINOGEN 0.2 05/13/2014 0642   NITRITE (A) 08/21/2019 1504    TEST NOT REPORTED DUE TO COLOR INTERFERENCE OF URINE PIGMENT   LEUKOCYTESUR (A) 08/21/2019 1504    TEST NOT REPORTED DUE TO COLOR INTERFERENCE OF URINE PIGMENT   Sepsis Labs: @LABRCNTIP (procalcitonin:4,lacticidven:4) ) Recent Results (from the past 240 hour(s))  SARS Coronavirus 2 by RT PCR (hospital  order, performed in [facility] hospital lab) Nasopharyngeal Nasopharyngeal Swab     Status: None   Collection Time: 08/21/19  4:34 PM   Specimen: Nasopharyngeal Swab  Result Value Ref Range Status   SARS Coronavirus 2 NEGATIVE NEGATIVE Final    Comment: (NOTE) SARS-CoV-2 target nucleic acids are NOT DETECTED.  The SARS-CoV-2 RNA is generally detectable in upper and lower respiratory specimens during the acute phase of infection. The lowest concentration of SARS-CoV-2 viral copies this assay can detect is 250 copies / mL. A negative result does not preclude SARS-CoV-2 infection and should not be used as the sole basis for treatment or other patient management decisions.  A negative result may occur with improper specimen collection / handling, submission of specimen other than nasopharyngeal swab, presence of viral mutation(s) within the areas targeted by this assay, and inadequate number of viral copies (<250 copies / mL). A negative result  must be combined with clinical observations, patient history, and epidemiological information.  Fact Sheet for Patients:   BoilerBrush.com.cy  Fact Sheet for Healthcare Providers: https://pope.com/  This test is not yet approved or  cleared by the [place] FDA and has been authorized for detection and/or diagnosis of SARS-CoV-2 by FDA under an Emergency Use Authorization (EUA).  This EUA will remain in effect (meaning this test can be used) for the duration of the COVID-19 declaration under Section 564(b)(1) of the Act, 21 U.S.C. section 360bbb-3(b)(1), unless the authorization is terminated or revoked sooner.  Performed at [facility], [address] [place], [place] [address]      Radiological Exams on Admission: CT ANGIO AO+BIFEM W & OR WO CONTRAST  Result Date: 08/21/2019 CLINICAL DATA:  Arterial embolization. Back pain and left leg pain and swelling. EXAM: CT  ANGIOGRAPHY OF ABDOMINAL AORTA WITH ILIOFEMORAL RUNOFF TECHNIQUE: Multidetector CT imaging of the abdomen, pelvis and lower extremities was performed using the standard protocol during bolus administration of intravenous contrast. Multiplanar CT image reconstructions and MIPs were obtained to evaluate the vascular anatomy. CONTRAST:  OMNIPAQUE IOHEXOL 350 MG/ML SOLN COMPARISON:  None. FINDINGS: VASCULAR Aorta: Normal caliber aorta without aneurysm, dissection, vasculitis or significant stenosis. Celiac: Patent without evidence of aneurysm, dissection, vasculitis or significant stenosis. SMA: Patent without evidence of aneurysm, dissection, vasculitis or significant stenosis. Renals: Both renal arteries are patent without evidence of aneurysm, dissection, vasculitis, fibromuscular dysplasia or significant stenosis. IMA: Patent without evidence of aneurysm, dissection, vasculitis or significant stenosis. RIGHT Lower Extremity Inflow: Common, internal and external iliac arteries are patent without evidence of aneurysm, dissection, vasculitis or significant stenosis. Outflow: Common, superficial and profunda femoral arteries and the popliteal artery are patent without evidence of aneurysm, dissection, vasculitis or significant stenosis. Runoff: There appears to be a 3 vessel runoff to the level ankle, however evaluation is limited by venous contamination. LEFT Lower Extremity Inflow: Common, internal and external iliac arteries are patent without evidence of aneurysm, dissection, vasculitis or significant stenosis. Outflow: Common, superficial and profunda femoral arteries and the popliteal artery are patent without evidence of aneurysm, dissection, vasculitis or significant stenosis. Runoff: There appears to be a 3 vessel runoff to the level of the ankle, however evaluation is limited by venous contamination. Veins: No obvious venous abnormality within the limitations of this arterial phase study. Review of the  MIP images confirms the above findings. NON-VASCULAR Lower chest: The lung bases are clear. The heart size is normal. Hepatobiliary: The liver is normal. Normal gallbladder.There is no biliary ductal dilation. Pancreas: Normal contours without ductal dilatation. No peripancreatic fluid collection. Spleen: Unremarkable. Adrenals/Urinary Tract: --Adrenal glands: Unremarkable. --Right kidney/ureter: No hydronephrosis or radiopaque kidney stones. --Left kidney/ureter: No hydronephrosis or radiopaque kidney stones. --Urinary bladder: Unremarkable. Stomach/Bowel: --Stomach/Duodenum: No hiatal hernia or other gastric abnormality. Normal duodenal course and caliber. --Small bowel: Unremarkable. --Colon: Unremarkable. --Appendix: Normal. Lymphatic: --No retroperitoneal lymphadenopathy. --No mesenteric lymphadenopathy. --No pelvic or inguinal lymphadenopathy. Reproductive: Unremarkable Other: No ascites or free air. The abdominal wall is normal. Musculoskeletal. There is hypoenhancement enlargement of the bilateral operator externus muscles as well as the left gluteal musculature and muscles of the anterior compartment of the proximal left thigh. There is asymmetric enlargement of the left soleus muscle which is also hypoenhancing. There is no rim enhancing fluid collection. There appears to be a soft tissue contusion involving the medial right lower extremity at the level of the mid tibia. There are small bilateral suprapatellar joint  effusions. IMPRESSION: 1. No evidence for an acute arterial abnormality. There appears to be a 3 vessel runoff to both ankles, however evaluation is limited by venous contamination. 2. Enlargement and hypoenhancement of multiple muscles of the left lower extremity as detailed above. This is a nonspecific finding. Differential considerations include rhabdomyolysis, myositis/pyomyositis, versus less likely autoimmune myositis or necrotizing fasciitis. Electronically Signed   By: Katherine Mantlehristopher   Green M.D.   On: 08/21/2019 16:16   US Venous Img Lower Unilateral Left (DVT)  Result Date: 08/21/2019 CLINICAL DATA:  Left leg swelling EXAM: LEFT LOWER EXTREMITY VENOUS DOPPLER ULTRASOUND TECHNIQUE: Gray-scale sonography with compression, as well as color and duplex ultrasound, were performed to evaluate the deep venous system(s) from the level of the common femoral vein through the popliteal and proximal calf veins. COMPARISON:  None. FINDINGS: VENOUS Normal compressibility of the common femoral, superficial femoral, and popliteal veins, as well as the visualized calf veins. Visualized portions of profunda femoral vein and great saphenous vein unremarkable. No filling defects to suggest DVT on grayscale or color Doppler imaging. Doppler waveforms show normal direction of venous flow, normal respiratory plasticity and response to augmentation. Limited views of the contralateral common femoral vein are unremarkable. OTHER None. Limitations: none IMPRESSION: Negative. Electronically Signed   By: Katherine Mantlehristopher  Green M.D.   On: 08/21/2019 15:07    EKG: Independently reviewed.  Normal sinus rhythm.  Assessment/Plan Principal Problem:   Rhabdomyolysis Active Problems:   Left leg weakness   Cold left foot   Polysubstance abuse (HCC)    1. Rhabdomyolysis likely related to fall.  For which nephrology recommended starting patient on bicarb drip.  Patient already received 3 L of normal saline bolus.  Follow CK levels closely. 2. Left lower extremity swelling and weakness concerning for possible developing compartment syndrome.  I discussed with on-call orthopedic surgeon Dr. Dion SaucierLandau who will be seeing patient in consult.  We will keep patient n.p.o. until seen by orthopedics. 3. Elevated LFTs likely from rhabdomyolysis.  Will in addition follow LFTs and check acute hepatitis panel and Tylenol level. 4. Syncope likely related to polysubstance abuse.  Will monitor in telemetry for any arrhythmia check 2D  echo check troponins. 5. Polysubstance abuse including ecstasy and drinking alcohol daily we will keep patient on CIWA protocol and social work consult but advised about quitting. 6. Hyponatremia and mild hypokalemia follow metabolic panel closely correct electrolytes accordingly.   Since patient has severe rhabdomyolysis with severe weakness of the left lower extremity with ongoing weakness will need close monitoring for any further worsening in inpatient status.  DVT prophylaxis: Patient is not on any pharmacological DVT because of possible need for surgery.  SCDs on the right lower extremity. Code Status: Full code. Family Communication: Discussed with patient. Disposition Plan: Home in stable. Consults called: Orthopedics.  ER physician discussed with nephrologist. Admission status: Inpatient.   Eduard ClosArshad N Denetra Formoso MD [facility] Pager [phone].  If 7PM-7AM, please contact night-coverage [email or web address] Password [email or web address]  08/22/2019, 12:10 AM

## 2019-08-22 NOTE — Progress Notes (Signed)
Patient given 1 mg Dilaudid for pain in his leg. Patient now sleeping.  Patient has normal vitals and in no distress at this time. Patient mother is at bedside.  Patient has his LLE elevated on pillows for comfort.

## 2019-08-22 NOTE — Progress Notes (Signed)
  Echocardiogram 2D Echocardiogram has been performed.  Andrew [name] 08/22/2019, 9:55 AM
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
## 2019-08-23 LAB — COMPREHENSIVE METABOLIC PANEL
ALT: 224 U/L — ABNORMAL HIGH (ref 0–44)
AST: 675 U/L — ABNORMAL HIGH (ref 15–41)
Albumin: 2.7 g/dL — ABNORMAL LOW (ref 3.5–5.0)
Alkaline Phosphatase: 57 U/L (ref 38–126)
Anion gap: 8 (ref 5–15)
BUN: <5 mg/dL — ABNORMAL LOW (ref 6–20)
CO2: 33 mmol/L — ABNORMAL HIGH (ref 22–32)
Calcium: 8.4 mg/dL — ABNORMAL LOW (ref 8.9–10.3)
Chloride: 94 mmol/L — ABNORMAL LOW (ref 98–111)
Creatinine, Ser: 0.78 mg/dL (ref 0.61–1.24)
GFR calc Af Amer: >60 mL/min (ref >60)
GFR calc non Af Amer: >60 mL/min (ref >60)
Glucose, Bld: 113 mg/dL — ABNORMAL HIGH (ref 70–99)
Potassium: 3.6 mmol/L (ref 3.5–5.1)
Sodium: 135 mmol/L (ref 135–145)
Total Bilirubin: 0.7 mg/dL (ref 0.3–1.2)
Total Protein: 6.1 g/dL — ABNORMAL LOW (ref 6.5–8.1)

## 2019-08-23 LAB — CK: Total CK: 25506 U/L — ABNORMAL HIGH (ref 49–397)

## 2019-08-23 MED ORDER — HEPARIN SODIUM (PORCINE) 5000 UNIT/ML IJ SOLN
5000.0000 [IU] | Freq: Three times a day (TID) | INTRAMUSCULAR | Status: DC
  Administered 2019-08-23 – 2019-08-26 (×8): 5000 [IU] via SUBCUTANEOUS
  Filled 2019-08-23 (×8): qty 1

## 2019-08-23 MED ORDER — SODIUM CHLORIDE 0.9 % IV SOLN: INTRAVENOUS | Status: DC

## 2019-08-23 MED ORDER — HYDROMORPHONE HCL 1 MG/ML IJ SOLN
0.5000 mg | INTRAMUSCULAR | Status: DC | PRN
  Administered 2019-08-23 – 2019-08-25 (×8): 1 mg via INTRAVENOUS
  Filled 2019-08-23 (×9): qty 1

## 2019-08-23 NOTE — Evaluation (Signed)
Physical Therapy Evaluation Patient Details Name: Andrew [name] MRN: [id number] DOB: 06/23/1990 Today's Date: 08/23/2019   History of Present Illness  29 y.o. male with history of depression and polysubstance abuse who is just out of the jail about a month ago and has been drinking alcohol every day admits to taking ecstasy a day before coming to the ER had a syncopal episode at home following which patient started noticing he was weakness both lower extremities when he woke up.  He does not remember how long he was on the floor.  His right lower extremity gained strength soon but his left lower extremity was getting more progressively swollen and weak and decreased strength.  He came to the ER. In the ER it was noticed that patient's left lower extremity was swollen cold to touch but had pulses which were dopplerable. Pt found to have rhabdomyolysis of L LE.   Clinical Impression  PTA pt living with mother and grandmother in multistory home with at least 5 steps to enter. Pt's bedroom/bathroom on second floor with 15 steps. Pt independent working 3 jobs prior to hospitalization. Pt is currently limited in safe mobility by decreased understanding of his deficits and decreased awareness of DME usage in presence of decreased strength and sensation in L LE. Pt is currently supervision for bed mobility, min guard for transfers and min A for ambulation with RW. PT recommending Outpatient PT at discharge for regaining strength and AROM. PT will continue to follow acutely.     Follow Up Recommendations Outpatient PT    Equipment Recommendations  Rolling walker with 5" wheels       Precautions / Restrictions Precautions Precautions: Fall Restrictions Weight Bearing Restrictions: No      Mobility  Bed Mobility Overal bed mobility: Needs Assistance Bed Mobility: Supine to Sit     Supine to sit: Supervision     General bed mobility comments: supervision for safety, increased cuing for use of L  LE muscles to move instead of using UE   Transfers Overall transfer level: Needs assistance Equipment used: Rolling walker (2 wheeled) Transfers: Sit to/from Stand Sit to Stand: Min guard         General transfer comment: min guard for safery, good power up increased effort to steady, vc for hand placement for power up  Ambulation/Gait Ambulation/Gait assistance: Min assist;Min guard Gait Distance (Feet): 20 Feet Assistive device: Rolling walker (2 wheeled) Gait Pattern/deviations: Step-through pattern;Decreased weight shift to left;Decreased step length - right;Decreased stance time - right;Trunk flexed Gait velocity: variable  Gait velocity interpretation: <1.31 ft/sec, indicative of household ambulator General Gait Details: minA progressing to min guard for steadying, vc for placing L heel on ground and increased L knee flexion with swing through as well as proximity to RW       Balance Overall balance assessment: Needs assistance Sitting-balance support: Feet supported;No upper extremity supported Sitting balance-Leahy Scale: Fair     Standing balance support: Single extremity supported;Bilateral upper extremity supported;During functional activity Standing balance-Leahy Scale: Poor Standing balance comment: requires at least single UE support                             Pertinent Vitals/Pain Pain Assessment: 0-10 Pain Score: 9  Pain Location: chronic low back pain  Pain Descriptors / Indicators: Sharp;Throbbing Pain Intervention(s): Limited activity within patient's tolerance;Monitored during session;Repositioned    Home Living Family/patient expects to be discharged to:: Private residence Living Arrangements:  Parent Available Help at Discharge: Family;Available 24 hours/day Type of Home: House Home Access: Stairs to enter   [employer] of Steps: 5 Home Layout: Multi-level;Bed/bath upstairs Home Equipment: None      Prior Function  Level of Independence: Independent                  Extremity/Trunk Assessment   Upper Extremity Assessment Upper Extremity Assessment: Overall WFL for tasks assessed    Lower Extremity Assessment Lower Extremity Assessment: LLE deficits/detail LLE Deficits / Details: PROM WFL, hip, knee and plantarflexion AROM WFL, lacks ~10 degrees of dorsiflexion LLE Sensation: decreased light touch;decreased proprioception LLE Coordination: decreased fine motor    Cervical / Trunk Assessment Cervical / Trunk Assessment: Other exceptions (chronic LBP)  Communication   Communication: No difficulties  Cognition Arousal/Alertness: Awake/alert Behavior During Therapy: Flat affect;Impulsive Overall Cognitive Status: Impaired/Different from baseline Area of Impairment: Problem solving;Safety/judgement;Following commands;Awareness                       Following Commands: Follows multi-step commands inconsistently Safety/Judgement: Decreased awareness of safety Awareness: Emergent Problem Solving: Requires verbal cues;Requires tactile cues;Difficulty sequencing General Comments: pt with decreased understanding of his deficits, requires increased cuing for RW usage      General Comments General comments (skin integrity, edema, etc.): L LE continues to have edema and erythema, VSS on RA, mother present during session     Exercises General Exercises - Lower Extremity Heel Slides: AROM;Left;10 reps;Seated Other Exercises Other Exercises: gastroc stretch with sheet x10 10 sec hold    Assessment/Plan    PT Assessment Patient needs continued PT services  PT Problem List Decreased strength;Decreased range of motion;Decreased activity tolerance;Decreased balance;Decreased mobility;Decreased knowledge of use of DME;Decreased safety awareness;Impaired sensation;Pain       PT Treatment Interventions DME instruction;Gait training;Stair training;Functional mobility training;Therapeutic  activities;Therapeutic exercise;Balance training;Cognitive remediation;Patient/family education    PT Goals (Current goals can be found in the Care Plan section)  Acute Rehab PT Goals Patient Stated Goal: get back to work PT Goal Formulation: With patient/family Time For Goal Achievement: 09/06/19 Potential to Achieve Goals: Fair    Frequency Min 3X/week    AM-PAC PT "6 Clicks" Mobility  Outcome Measure Help needed turning from your back to your side while in a flat bed without using bedrails?: None Help needed moving from lying on your back to sitting on the side of a flat bed without using bedrails?: None Help needed moving to and from a bed to a chair (including a wheelchair)?: A Little Help needed standing up from a chair using your arms (e.g., wheelchair or bedside chair)?: A Little Help needed to walk in hospital room?: A Little Help needed climbing 3-5 steps with a railing? : Total 6 Click Score: 18    End of Session   Activity Tolerance: Patient tolerated treatment well Patient left: in chair;with call bell/phone within reach;with family/visitor present Nurse Communication: Mobility status;Patient requests pain meds PT Visit Diagnosis: Unsteadiness on feet (R26.81);Other abnormalities of gait and mobility (R26.89);Muscle weakness (generalized) (M62.81);Difficulty in walking, not elsewhere classified (R26.2);Other symptoms and signs involving the nervous system (R29.898);Pain Pain - Right/Left: Left Pain - part of body:  (low back)    Time: 3893-7342 PT Time Calculation (min) (ACUTE ONLY): 60 min   Charges:   PT Evaluation $PT Eval Moderate Complexity: 1 Mod PT Treatments $Gait Training: 8-22 mins $Therapeutic Exercise: 8-22 mins $Therapeutic Activity: 8-22 mins        Lanora Manis  Karma Ganja PT, DPT Acute Rehabilitation Services Pager [phone] Office [phone]   Elon Alas [facility] 08/23/2019, 6:49 PM

## 2019-08-23 NOTE — TOC CAGE-AID Note (Signed)
Transition of Care [facility]) - CAGE-AID Screening   Patient Details  Name: Andrew [name] MRN: [id number] Date of Birth: [birth year]  Transition of Care [facility] CM/SW Contact:    Emeterio Reeve, [place] Phone Number: 08/23/2019, 1:02 PM   Clinical Narrative:  CSW met with pt at bedside. CSW introduced self and explained her role at the hospital.  Pts mother was present, pt gave permission to ask questions in front of mom. Pt reports daily alcohol use. Pt reports occasoinal substance use.   Pt was receptive to counseling and resources. Pt stated that originally he didn't think he had a problem but now thinks that he could use some help cutting back his alcohol use.   CAGE-AID Screening:    Have You Ever Felt You Ought to Cut Down on Your Drinking or Drug Use?: No Have People Annoyed You By Critizing Your Drinking Or Drug Use?: No Have You Felt Bad Or Guilty About Your Drinking Or Drug Use?: No Have You Ever Had a Drink or Used Drugs First Thing In The Morning to Steady Your Nerves or to Get Rid of a Hangover?: No CAGE-AID Score: 0  Substance Abuse Education Offered: Yes    Blima Ledger, [place] Social Worker [phone]

## 2019-08-23 NOTE — Progress Notes (Signed)
Subjective: Patient notes continued back pain and pain in left buttock, improved with repositioning in bed. No other complaints.   Objective:  PE: VITALS:   Vitals:   08/22/19 2043 08/22/19 2045 08/23/19 0126 08/23/19 0444  BP: (!) 157/93 (!) 157/93 (!) 152/95 (!) 147/101  Pulse: 71 71  84  Resp: 18   20  Temp: 99 F (37.2 C)   98.2 F (36.8 C)  TempSrc: Oral   Oral  SpO2: 97%   99%  Weight:    130 kg  Height:       General: Alert, oriented, in no acute distress MSK: Left buttock and thigh are soft to palpation. Continued severe swelling of left lower leg, compartments not compressible. Faint DP pulse found with doppler, faint PT pulse found with doppler.Patient reports sensation to about 5 inches proximal to the lateral malleolus. Able to flex and extend all toes of the left foot today. Able to dorsiflex approx 5 degrees.   LABS  Results for orders placed or performed during the hospital encounter of 08/21/19 (from the past 24 hour(s))  Comprehensive metabolic panel     Status: Abnormal   Collection Time: 08/23/19  9:53 AM  Result Value Ref Range   Sodium 135 135 - 145 mmol/L   Potassium 3.6 3.5 - 5.1 mmol/L   Chloride 94 (L) 98 - 111 mmol/L   CO2 33 (H) 22 - 32 mmol/L   Glucose, Bld 113 (H) 70 - 99 mg/dL   BUN <5 (L) 6 - 20 mg/dL   Creatinine, Ser 1.610.78 0.61 - 1.24 mg/dL   Calcium 8.4 (L) 8.9 - 10.3 mg/dL   Total Protein 6.1 (L) 6.5 - 8.1 g/dL   Albumin 2.7 (L) 3.5 - 5.0 g/dL   AST 096675 (H) 15 - 41 U/L   ALT 224 (H) 0 - 44 U/L   Alkaline Phosphatase 57 38 - 126 U/L   Total Bilirubin 0.7 0.3 - 1.2 mg/dL   GFR calc non Af Amer >60 >60 mL/min   GFR calc Af Amer >60 >60 mL/min   Anion gap 8 5 - 15    CT ANGIO AO+BIFEM W & OR WO CONTRAST  Result Date: 08/21/2019 CLINICAL DATA:  Arterial embolization. Back pain and left leg pain and swelling. EXAM: CT ANGIOGRAPHY OF ABDOMINAL AORTA WITH ILIOFEMORAL RUNOFF TECHNIQUE: Multidetector CT imaging of the abdomen, pelvis  and lower extremities was performed using the standard protocol during bolus administration of intravenous contrast. Multiplanar CT image reconstructions and MIPs were obtained to evaluate the vascular anatomy. CONTRAST:  100mL OMNIPAQUE IOHEXOL 350 MG/ML SOLN COMPARISON:  None. FINDINGS: VASCULAR Aorta: Normal caliber aorta without aneurysm, dissection, vasculitis or significant stenosis. Celiac: Patent without evidence of aneurysm, dissection, vasculitis or significant stenosis. SMA: Patent without evidence of aneurysm, dissection, vasculitis or significant stenosis. Renals: Both renal arteries are patent without evidence of aneurysm, dissection, vasculitis, fibromuscular dysplasia or significant stenosis. IMA: Patent without evidence of aneurysm, dissection, vasculitis or significant stenosis. RIGHT Lower Extremity Inflow: Common, internal and external iliac arteries are patent without evidence of aneurysm, dissection, vasculitis or significant stenosis. Outflow: Common, superficial and profunda femoral arteries and the popliteal artery are patent without evidence of aneurysm, dissection, vasculitis or significant stenosis. Runoff: There appears to be a 3 vessel runoff to the level ankle, however evaluation is limited by venous contamination. LEFT Lower Extremity Inflow: Common, internal and external iliac arteries are patent without evidence of aneurysm, dissection, vasculitis or significant stenosis. Outflow: Common,  superficial and profunda femoral arteries and the popliteal artery are patent without evidence of aneurysm, dissection, vasculitis or significant stenosis. Runoff: There appears to be a 3 vessel runoff to the level of the ankle, however evaluation is limited by venous contamination. Veins: No obvious venous abnormality within the limitations of this arterial phase study. Review of the MIP images confirms the above findings. NON-VASCULAR Lower chest: The lung bases are clear. The heart size is  normal. Hepatobiliary: The liver is normal. Normal gallbladder.There is no biliary ductal dilation. Pancreas: Normal contours without ductal dilatation. No peripancreatic fluid collection. Spleen: Unremarkable. Adrenals/Urinary Tract: --Adrenal glands: Unremarkable. --Right kidney/ureter: No hydronephrosis or radiopaque kidney stones. --Left kidney/ureter: No hydronephrosis or radiopaque kidney stones. --Urinary bladder: Unremarkable. Stomach/Bowel: --Stomach/Duodenum: No hiatal hernia or other gastric abnormality. Normal duodenal course and caliber. --Small bowel: Unremarkable. --Colon: Unremarkable. --Appendix: Normal. Lymphatic: --No retroperitoneal lymphadenopathy. --No mesenteric lymphadenopathy. --No pelvic or inguinal lymphadenopathy. Reproductive: Unremarkable Other: No ascites or free air. The abdominal wall is normal. Musculoskeletal. There is hypoenhancement enlargement of the bilateral operator externus muscles as well as the left gluteal musculature and muscles of the anterior compartment of the proximal left thigh. There is asymmetric enlargement of the left soleus muscle which is also hypoenhancing. There is no rim enhancing fluid collection. There appears to be a soft tissue contusion involving the medial right lower extremity at the level of the mid tibia. There are small bilateral suprapatellar joint effusions. IMPRESSION: 1. No evidence for an acute arterial abnormality. There appears to be a 3 vessel runoff to both ankles, however evaluation is limited by venous contamination. 2. Enlargement and hypoenhancement of multiple muscles of the left lower extremity as detailed above. This is a nonspecific finding. Differential considerations include rhabdomyolysis, myositis/pyomyositis, versus less likely autoimmune myositis or necrotizing fasciitis. Electronically Signed   By: Katherine Mantle M.D.   On: 08/21/2019 16:16   US Venous Img Lower Unilateral Left (DVT)  Result Date:  08/21/2019 CLINICAL DATA:  Left leg swelling EXAM: LEFT LOWER EXTREMITY VENOUS DOPPLER ULTRASOUND TECHNIQUE: Gray-scale sonography with compression, as well as color and duplex ultrasound, were performed to evaluate the deep venous system(s) from the level of the common femoral vein through the popliteal and proximal calf veins. COMPARISON:  None. FINDINGS: VENOUS Normal compressibility of the common femoral, superficial femoral, and popliteal veins, as well as the visualized calf veins. Visualized portions of profunda femoral vein and great saphenous vein unremarkable. No filling defects to suggest DVT on grayscale or color Doppler imaging. Doppler waveforms show normal direction of venous flow, normal respiratory plasticity and response to augmentation. Limited views of the contralateral common femoral vein are unremarkable. OTHER None. Limitations: none IMPRESSION: Negative. Electronically Signed   By: Katherine Mantle M.D.   On: 08/21/2019 15:07   ECHOCARDIOGRAM COMPLETE  Result Date: 08/22/2019    ECHOCARDIOGRAM REPORT   Patient Name:   MARX DOIG Date of Exam: 08/22/2019 Medical Rec #:  [id number]       Height:       72.0 in Accession #:    [id number]      Weight:       260.0 lb Date of Birth:  June 12, 1990        BSA:          2.382 m Patient Age:    28 years        BP:           148/94 mmHg Patient Gender: M  HR:           82 bpm. Exam Location:  Inpatient Procedure: 2D Echo Indications:    Syncope R55  History:        Patient has no prior history of Echocardiogram examinations.  Sonographer:    Thurman Coyer RDCS (AE) Referring Phys: [id number] [facility] IMPRESSIONS  1. Left ventricular ejection fraction, by estimation, is 55 to 60%. The left ventricle has normal function. The left ventricle has no regional wall motion abnormalities. Left ventricular diastolic parameters were normal.  2. Right ventricular systolic function is normal. The right ventricular size is normal. Tricuspid  regurgitation signal is inadequate for assessing PA pressure.  3. The mitral valve is normal in structure. No evidence of mitral valve regurgitation. No evidence of mitral stenosis.  4. The aortic valve is tricuspid. Aortic valve regurgitation is not visualized. No aortic stenosis is present.  5. The inferior vena cava is normal in size with greater than 50% respiratory variability, suggesting right atrial pressure of 3 mmHg. FINDINGS  Left Ventricle: Left ventricular ejection fraction, by estimation, is 55 to 60%. The left ventricle has normal function. The left ventricle has no regional wall motion abnormalities. The left ventricular internal cavity size was normal in size. There is  no left ventricular hypertrophy. Left ventricular diastolic parameters were normal. Right Ventricle: The right ventricular size is normal. No increase in right ventricular wall thickness. Right ventricular systolic function is normal. Tricuspid regurgitation signal is inadequate for assessing PA pressure. Left Atrium: Left atrial size was normal in size. Right Atrium: Right atrial size was normal in size. Pericardium: Trivial pericardial effusion is present. Mitral Valve: The mitral valve is normal in structure. No evidence of mitral valve regurgitation. No evidence of mitral valve stenosis. Tricuspid Valve: The tricuspid valve is normal in structure. Tricuspid valve regurgitation is not demonstrated. Aortic Valve: The aortic valve is tricuspid. Aortic valve regurgitation is not visualized. No aortic stenosis is present. Pulmonic Valve: The pulmonic valve was normal in structure. Pulmonic valve regurgitation is not visualized. Aorta: The aortic root is normal in size and structure. Venous: The inferior vena cava is normal in size with greater than 50% respiratory variability, suggesting right atrial pressure of 3 mmHg. IAS/Shunts: No atrial level shunt detected by color flow Doppler.  LEFT VENTRICLE PLAX 2D LVIDd:         4.70 cm       Diastology LVIDs:         3.00 cm      LV e' lateral:   9.14 cm/s LV PW:         1.20 cm      LV E/e' lateral: 6.0 LV IVS:        1.10 cm      LV e' medial:    8.81 cm/s LVOT diam:     2.00 cm      LV E/e' medial:  6.2 LV SV:         53 LV SV Index:   22 LVOT Area:     3.14 cm  LV Volumes (MOD) LV vol d, MOD A2C: 108.0 ml LV vol d, MOD A4C: 110.0 ml LV vol s, MOD A2C: 52.5 ml LV vol s, MOD A4C: 54.6 ml LV SV MOD A2C:     55.5 ml LV SV MOD A4C:     110.0 ml LV SV MOD BP:      55.6 ml RIGHT VENTRICLE RV S prime:  10.60 cm/s TAPSE (M-mode): 1.8 cm LEFT ATRIUM             Index       RIGHT ATRIUM           Index LA diam:        3.70 cm 1.55 cm/m  RA Area:     12.30 cm LA Vol (A2C):   41.2 ml 17.30 ml/m RA Volume:   24.30 ml  10.20 ml/m LA Vol (A4C):   23.3 ml 9.78 ml/m LA Biplane Vol: 31.9 ml 13.39 ml/m  AORTIC VALVE LVOT Vmax:   104.00 cm/s LVOT Vmean:  67.800 cm/s LVOT VTI:    0.168 m  AORTA Ao Root diam: 3.10 cm MITRAL VALVE MV Area (PHT): 3.19 cm    SHUNTS MV Decel Time: 238 msec    Systemic VTI:  0.17 m MV E velocity: 54.80 cm/s  Systemic Diam: 2.00 cm MV A velocity: 52.70 cm/s MV E/A ratio:  1.04 Marca Ancona MD Electronically signed by Marca Ancona MD Signature Date/Time: 08/22/2019/4:09:34 PM    Final     Assessment/Plan:  Rhabdomyolysis withsubacute, chroniccompartment syndrome of left lower legsecondary to narcotic overdose, question pressure ischemia versus drug-induced ischemia - increased motion in toes today and dorsiflexion at ankle, will continue to keep an eye on this as it progresses - continue neurovascular checks with doppler  - continued to encourage movement and changing location in bed to take pressure off of low back and buttock - continued soft compartments of left buttock and left upper leg - no surgical intervention planned - ordered Prafo ankle boot for left ankle  Contact information:   Weekdays 8-5 Janine Ores, PA-C [phone] A fter hours and holidays please  check [email or web address] for group call information for [facility]  Armida Sans 08/23/2019, 12:10 PM

## 2019-08-23 NOTE — Progress Notes (Signed)
Orthopedic Tech Progress Note Patient Details:  Andrew [name] 21-May-1990 [id number] Called order into [facility] for walking Prafo boot. Patient ID: Andrew [name], male   DOB: Mar 13, 1990, 29 y.o.   MRN: [id number]   Gerald Stabs 08/23/2019, 6:00 PM

## 2019-08-23 NOTE — Plan of Care (Signed)
  Problem: Clinical Measurements: Goal: Ability to maintain clinical measurements within normal limits will improve Outcome: Progressing   Problem: Nutrition: Goal: Adequate nutrition will be maintained Outcome: Progressing   

## 2019-08-23 NOTE — Progress Notes (Signed)
PROGRESS NOTE    Andrew [name]  ULA:[id number] DOB: [birth year] DOA: 08/21/2019 PCP: Patient, No Pcp Per    Chief Complaint  Patient presents with  . Hematuria  . Back Pain    Brief Narrative:  29 year old gentleman presented to ED for left lower extremity swelling and pain was found to have rhabdomyolysis with subacute compartment syndrome of the left lower leg probably secondary to narcotic overdose versus pressure ischemia.  Orthopedics consulted and recommended no surgical intervention at this time and to continue with neurovascular checks with Doppler  Assessment & Plan:   Principal Problem:   Rhabdomyolysis Active Problems:   Left leg weakness   Cold left foot   Polysubstance abuse (HCC)  Rhabdomyolysis with sub acute compartment syndrome of the lft lower leg sec to narcotic overdose versus pressure ischemia. CK levels are improving. Continue with IV fluids. Orthopedics consulted recommended neurovascular checks with Doppler.  On exam today there is faint DP pulse and patient is able to move his toes.  Pain control with IV Dilaudid and oral oral oxycodone. PT evaluation ordered.    Polysubstance abuse TOC consulted for resources.   Syncope probably secondary to polysubstance abuse Echocardiogram shows  Left ventricular ejection fraction, by estimation, is 55 to 60%. The  left ventricle has normal function. The left ventricle has no regional  wall motion abnormalities. Left ventricular diastolic parameters were  Normal.   Hyponatremia Resolved with IV fluids.  Hypokalemia Replaced.   Elevated liver enzymes probably secondary to rhabdomyolysis Improving.      DVT prophylaxis: Heparin.  Code Status: FULL CODE.  Family Communication: mother at bedside, .  Disposition:   Status is: Inpatient  Remains inpatient appropriate because:Ongoing diagnostic testing needed not appropriate for outpatient work up and IV treatments appropriate due to intensity of  illness or inability to take PO   Dispo: The patient is from: Home              Anticipated d/c is to: pending.               Anticipated d/c date is: > 3 days              Patient currently is not medically stable to d/c.       Consultants:   Orthopedics.    Procedures: echocardiogram  Left ventricular ejection fraction, by estimation, is 55 to 60%. The  left ventricle has normal function. The left ventricle has no regional  wall motion abnormalities. Left ventricular diastolic parameters were  normal.   Antimicrobials: none.    Subjective: Sleepy, but able to answer all questions.   Objective: Vitals:   08/22/19 2045 08/23/19 0126 08/23/19 0444 08/23/19 1545  BP: (!) 157/93 (!) 152/95 (!) 147/101   Pulse: 71  84   Resp:   20   Temp:   98.2 F (36.8 C) 98.8 F (37.1 C)  TempSrc:   Oral Oral  SpO2:   99%   Weight:   130 kg   Height:        Intake/Output Summary (Last 24 hours) at 08/23/2019 1755 Last data filed at 08/23/2019 1454 Gross per 24 hour  Intake 600 ml  Output 600 ml  Net 0 ml   Filed Weights   08/21/19 1303 08/23/19 0444  Weight: 117.9 kg 130 kg    Examination:  General exam: Appears calm and comfortable  Respiratory system: Clear to auscultation. Respiratory effort normal. Cardiovascular system: S1 & S2 heard, RRR. No JVD, . No  pedal edema. Gastrointestinal system: Abdomen is nondistended, soft and nontender.Normal bowel sounds heard. Central nervous system: Alert and oriented. No focal neurological deficits. Extremities: left lower extremity swollen, tenderness present, pt able to flex his toes,  faint DP pulses.  Skin: No rashes, lesions or ulcers Psychiatry:  Mood & affect appropriate.     Data Reviewed: I have personally reviewed following labs and imaging studies  CBC: Recent Labs  Lab 08/21/19 1338 08/22/19 0857  WBC 11.3* 8.7  NEUTROABS 8.5* 6.2  HGB 14.2 13.0  HCT 45.1 41.5  MCV 86.7 85.6  PLT 322 274    Basic  Metabolic Panel: Recent Labs  Lab 08/21/19 1338 08/22/19 0857 08/23/19 0953  NA 129* 135 135  K 3.3* 3.3* 3.6  CL 95* 98 94*  CO2 23 28 33*  GLUCOSE 94 110* 113*  BUN 14 9 <5*  CREATININE 1.04 0.91 0.78  CALCIUM 8.1* 8.2* 8.4*  MG  --  2.0  --   PHOS  --  3.3  --     GFR: Estimated Creatinine Clearance: 191.7 mL/min (by C-G formula based on SCr of 0.78 mg/dL).  Liver Function Tests: Recent Labs  Lab 08/21/19 1338 08/22/19 0857 08/23/19 0953  AST 1,424* 964*  959* 675*  ALT 270* 264*  262* 224*  ALKPHOS 82 64  66 57  BILITOT 0.7 0.4  0.5 0.7  PROT 7.9 6.1*  6.2* 6.1*  ALBUMIN 4.1 2.8*  2.7* 2.7*    CBG: Recent Labs  Lab 08/21/19 1323  GLUCAP 74     Recent Results (from the past 240 hour(s))  SARS Coronavirus 2 by RT PCR (hospital order, performed in [facility] hospital lab) Nasopharyngeal Nasopharyngeal Swab     Status: None   Collection Time: 08/21/19  4:34 PM   Specimen: Nasopharyngeal Swab  Result Value Ref Range Status   SARS Coronavirus 2 NEGATIVE NEGATIVE Final    Comment: (NOTE) SARS-CoV-2 target nucleic acids are NOT DETECTED.  The SARS-CoV-2 RNA is generally detectable in upper and lower respiratory specimens during the acute phase of infection. The lowest concentration of SARS-CoV-2 viral copies this assay can detect is 250 copies / mL. A negative result does not preclude SARS-CoV-2 infection and should not be used as the sole basis for treatment or other patient management decisions.  A negative result may occur with improper specimen collection / handling, submission of specimen other than nasopharyngeal swab, presence of viral mutation(s) within the areas targeted by this assay, and inadequate number of viral copies (<250 copies / mL). A negative result must be combined with clinical observations, patient history, and epidemiological information.  Fact Sheet for Patients:   BoilerBrush.com.cy  Fact Sheet for  Healthcare Providers: https://pope.com/  This test is not yet approved or  cleared by the [place] FDA and has been authorized for detection and/or diagnosis of SARS-CoV-2 by FDA under an Emergency Use Authorization (EUA).  This EUA will remain in effect (meaning this test can be used) for the duration of the COVID-19 declaration under Section 564(b)(1) of the Act, 21 U.S.C. section 360bbb-3(b)(1), unless the authorization is terminated or revoked sooner.  Performed at [facility], [address] [place], [place] [address]          Radiology Studies: ECHOCARDIOGRAM COMPLETE  Result Date: 08/22/2019    ECHOCARDIOGRAM REPORT   Patient Name:   ALEJANDRO GAMEL Date of Exam: 08/22/2019 Medical Rec #:  [id number]       Height:  72.0 in Accession #:    [id number]      Weight:       260.0 lb Date of Birth:  26-Aug-1990        BSA:          2.382 m Patient Age:    28 years        BP:           148/94 mmHg Patient Gender: M               HR:           82 bpm. Exam Location:  Inpatient Procedure: 2D Echo Indications:    Syncope R55  History:        Patient has no prior history of Echocardiogram examinations.  Sonographer:    Thurman Coyer RDCS (AE) Referring Phys: [id number] [facility] IMPRESSIONS  1. Left ventricular ejection fraction, by estimation, is 55 to 60%. The left ventricle has normal function. The left ventricle has no regional wall motion abnormalities. Left ventricular diastolic parameters were normal.  2. Right ventricular systolic function is normal. The right ventricular size is normal. Tricuspid regurgitation signal is inadequate for assessing PA pressure.  3. The mitral valve is normal in structure. No evidence of mitral valve regurgitation. No evidence of mitral stenosis.  4. The aortic valve is tricuspid. Aortic valve regurgitation is not visualized. No aortic stenosis is present.  5. The inferior vena cava is normal in size with  greater than 50% respiratory variability, suggesting right atrial pressure of 3 mmHg. FINDINGS  Left Ventricle: Left ventricular ejection fraction, by estimation, is 55 to 60%. The left ventricle has normal function. The left ventricle has no regional wall motion abnormalities. The left ventricular internal cavity size was normal in size. There is  no left ventricular hypertrophy. Left ventricular diastolic parameters were normal. Right Ventricle: The right ventricular size is normal. No increase in right ventricular wall thickness. Right ventricular systolic function is normal. Tricuspid regurgitation signal is inadequate for assessing PA pressure. Left Atrium: Left atrial size was normal in size. Right Atrium: Right atrial size was normal in size. Pericardium: Trivial pericardial effusion is present. Mitral Valve: The mitral valve is normal in structure. No evidence of mitral valve regurgitation. No evidence of mitral valve stenosis. Tricuspid Valve: The tricuspid valve is normal in structure. Tricuspid valve regurgitation is not demonstrated. Aortic Valve: The aortic valve is tricuspid. Aortic valve regurgitation is not visualized. No aortic stenosis is present. Pulmonic Valve: The pulmonic valve was normal in structure. Pulmonic valve regurgitation is not visualized. Aorta: The aortic root is normal in size and structure. Venous: The inferior vena cava is normal in size with greater than 50% respiratory variability, suggesting right atrial pressure of 3 mmHg. IAS/Shunts: No atrial level shunt detected by color flow Doppler.  LEFT VENTRICLE PLAX 2D LVIDd:         4.70 cm      Diastology LVIDs:         3.00 cm      LV e' lateral:   9.14 cm/s LV PW:         1.20 cm      LV E/e' lateral: 6.0 LV IVS:        1.10 cm      LV e' medial:    8.81 cm/s LVOT diam:     2.00 cm      LV E/e' medial:  6.2 LV SV:  53 LV SV Index:   22 LVOT Area:     3.14 cm  LV Volumes (MOD) LV vol d, MOD A2C: 108.0 ml LV vol d, MOD A4C:  110.0 ml LV vol s, MOD A2C: 52.5 ml LV vol s, MOD A4C: 54.6 ml LV SV MOD A2C:     55.5 ml LV SV MOD A4C:     110.0 ml LV SV MOD BP:      55.6 ml RIGHT VENTRICLE RV S prime:     10.60 cm/s TAPSE (M-mode): 1.8 cm LEFT ATRIUM             Index       RIGHT ATRIUM           Index LA diam:        3.70 cm 1.55 cm/m  RA Area:     12.30 cm LA Vol (A2C):   41.2 ml 17.30 ml/m RA Volume:   24.30 ml  10.20 ml/m LA Vol (A4C):   23.3 ml 9.78 ml/m LA Biplane Vol: 31.9 ml 13.39 ml/m  AORTIC VALVE LVOT Vmax:   104.00 cm/s LVOT Vmean:  67.800 cm/s LVOT VTI:    0.168 m  AORTA Ao Root diam: 3.10 cm MITRAL VALVE MV Area (PHT): 3.19 cm    SHUNTS MV Decel Time: 238 msec    Systemic VTI:  0.17 m MV E velocity: 54.80 cm/s  Systemic Diam: 2.00 cm MV A velocity: 52.70 cm/s MV E/A ratio:  1.04 Marca Anconaalton Mclean MD Electronically signed by Marca Anconaalton Mclean MD Signature Date/Time: 08/22/2019/4:09:34 PM    Final         Scheduled Meds: . folic acid  1 mg Oral Daily  . LORazepam  0-4 mg Intravenous Q6H   Followed by  . [START ON 08/24/2019] LORazepam  0-4 mg Intravenous Q12H  . multivitamin with minerals  1 tablet Oral Daily  . thiamine  100 mg Oral Daily   Or  . thiamine  100 mg Intravenous Daily   Continuous Infusions:    LOS: 2 days       Kathlen ModyVijaya Roselene Gray, MD [facility]   To contact the attending provider between 7A-7P or the covering provider during after hours 7P-7A, please log into the web site [email or web address] and access using universal [place] password for that web site. If you do not have the password, please call the hospital operator.  08/23/2019, 5:55 PM

## 2019-08-24 LAB — CK: Total CK: 26399 U/L — ABNORMAL HIGH (ref 49–397)

## 2019-08-24 LAB — BASIC METABOLIC PANEL
Anion gap: 8 (ref 5–15)
BUN: 6 mg/dL (ref 6–20)
CO2: 31 mmol/L (ref 22–32)
Calcium: 8.5 mg/dL — ABNORMAL LOW (ref 8.9–10.3)
Chloride: 98 mmol/L (ref 98–111)
Creatinine, Ser: 0.82 mg/dL (ref 0.61–1.24)
GFR calc Af Amer: >60 mL/min (ref >60)
GFR calc non Af Amer: >60 mL/min (ref >60)
Glucose, Bld: 127 mg/dL — ABNORMAL HIGH (ref 70–99)
Potassium: 3.8 mmol/L (ref 3.5–5.1)
Sodium: 137 mmol/L (ref 135–145)

## 2019-08-24 MED ORDER — OXYCODONE HCL 5 MG PO TABS: 5.0000 mg | ORAL_TABLET | ORAL | 0 refills | Status: DC | PRN

## 2019-08-24 MED FILL — oxyCODONE HCL 5 MG TABS: 5 | 5 days supply | Qty: 30 | Fill #0

## 2019-08-24 NOTE — Progress Notes (Signed)
Physical Therapy Treatment Patient Details Name: Andrew [name] MRN: [id number] DOB: 1990/08/31 Today's Date: 08/24/2019    History of Present Illness 29 y.o. male with history of depression and polysubstance abuse who is just out of the jail about a month ago and has been drinking alcohol every day admits to taking ecstasy a day before coming to the ER had a syncopal episode at home following which patient started noticing he was weakness both lower extremities when he woke up.  He does not remember how long he was on the floor.  His right lower extremity gained strength soon but his left lower extremity was getting more progressively swollen and weak and decreased strength.  He came to the ER. In the ER it was noticed that patient's left lower extremity was swollen cold to touch but had pulses which were dopplerable. Pt found to have rhabdomyolysis of L LE.     PT Comments    RN reports pt has been in bed all day and has not moved. Pt awake on entry and agreeable to therapy, admits he has done no exercises since session yesterday. Prafo boot in place on L ankle. Removed and pt able to perform increased plantar/dorsiflexion and reports improved sensation in toes and heel. CAM walker not in room so performed ambulation with RW and TTWB to reduce risk of ankle instability with gait, min A and increased cuing needed. Once back in recliner instructed in LE AROM to reduce edema in L thigh and improve blood flow to LE. Pt educated for need to return Prafo boot after therex. Pt and mother continue to have decreased awareness of deficits and need for mobilization of L LE . Pt will need stair training for safe discharge home. RN notified of need to call [employer] for CAM Walker. D/c plans remain appropriate at this time. PT will continue to follow acutely.   Follow Up Recommendations  Outpatient PT     Equipment Recommendations  Rolling walker with 5" wheels;Other (comment) (CAM boot)       Precautions  / Restrictions Precautions Precautions: Fall Restrictions Weight Bearing Restrictions: No LLE Weight Bearing: Weight bearing as tolerated    Mobility  Bed Mobility Overal bed mobility: Needs Assistance Bed Mobility: Supine to Sit     Supine to sit: Supervision     General bed mobility comments: continues to need cuing for using L LE to move not his UE  Transfers Overall transfer level: Needs assistance Equipment used: Rolling walker (2 wheeled) Transfers: Sit to/from Stand Sit to Stand: Min guard         General transfer comment: min guard for safery, good power up from bed surface and lower toilet surface  Ambulation/Gait Ambulation/Gait assistance: Min assist;Min guard Gait Distance (Feet): 16 Feet Assistive device: Rolling walker (2 wheeled) Gait Pattern/deviations: Decreased step length - right;Trunk flexed;Step-to pattern Gait velocity: variable  Gait velocity interpretation: <1.31 ft/sec, indicative of household ambulator General Gait Details: min A for steadying, cuing for reduced weightbearing in L LE as CAM walker not present during session           Balance Overall balance assessment: Needs assistance Sitting-balance support: Feet supported;No upper extremity supported Sitting balance-Leahy Scale: Fair     Standing balance support: Single extremity supported;Bilateral upper extremity supported;During functional activity Standing balance-Leahy Scale: Poor Standing balance comment: requires at least single UE support  Cognition Arousal/Alertness: Awake/alert Behavior During Therapy: Flat affect;Impulsive Overall Cognitive Status: Impaired/Different from baseline Area of Impairment: Problem solving;Safety/judgement;Following commands;Awareness                       Following Commands: Follows multi-step commands inconsistently Safety/Judgement: Decreased awareness of safety Awareness: Emergent Problem  Solving: Requires verbal cues;Requires tactile cues;Difficulty sequencing General Comments: continues to have poor understanding of his deficits and potential to for loss of ankle plantar/dorsiflexion       Exercises General Exercises - Lower Extremity Long Arc Quad: AROM;Both;10 reps;Seated Heel Slides: AROM;Left;10 reps;Seated Hip ABduction/ADduction: AROM;10 reps;Seated;Both Hip Flexion/Marching: AROM;Both;10 reps;Seated Other Exercises Other Exercises: gastroc stretch with sheet x10 10 sec hold     General Comments General comments (skin integrity, edema, etc.): increased swelling in L thigh today, pt with improved dorsi/plantar flexion and sensation       Pertinent Vitals/Pain Pain Assessment: 0-10 Pain Score: 8  Pain Location: chronic low back pain  Pain Descriptors / Indicators: Sharp;Throbbing Pain Intervention(s): Limited activity within patient's tolerance;Monitored during session;Patient requesting pain meds-RN notified     PT Goals (current goals can now be found in the care plan section) Acute Rehab PT Goals Patient Stated Goal: get back to work PT Goal Formulation: With patient/family Time For Goal Achievement: 09/06/19 Potential to Achieve Goals: Fair Progress towards PT goals: Progressing toward goals    Frequency    Min 3X/week      PT Plan Current plan remains appropriate       AM-PAC PT "6 Clicks" Mobility   Outcome Measure  Help needed turning from your back to your side while in a flat bed without using bedrails?: None Help needed moving from lying on your back to sitting on the side of a flat bed without using bedrails?: None Help needed moving to and from a bed to a chair (including a wheelchair)?: A Little Help needed standing up from a chair using your arms (e.g., wheelchair or bedside chair)?: A Little Help needed to walk in hospital room?: A Little Help needed climbing 3-5 steps with a railing? : Total 6 Click Score: 18    End of  Session Equipment Utilized During Treatment: Gait belt Activity Tolerance: Patient tolerated treatment well Patient left: in chair;with call bell/phone within reach;with family/visitor present Nurse Communication: Mobility status;Patient requests pain meds PT Visit Diagnosis: Unsteadiness on feet (R26.81);Other abnormalities of gait and mobility (R26.89);Muscle weakness (generalized) (M62.81);Difficulty in walking, not elsewhere classified (R26.2);Other symptoms and signs involving the nervous system (R29.898);Pain Pain - Right/Left: Left Pain - part of body:  (low back)     Time: 4010-2725 PT Time Calculation (min) (ACUTE ONLY): 41 min  Charges:  $Gait Training: 8-22 mins $Therapeutic Exercise: 8-22 mins $Therapeutic Activity: 8-22 mins                     Amadeus Oyama B. Beverely Risen PT, DPT Acute Rehabilitation Services Pager [phone] Office [phone]    [facility] 08/24/2019, 5:47 PM

## 2019-08-24 NOTE — Plan of Care (Signed)
°  Problem: Education: Goal: Knowledge of General Education information will improve Description: Including pain rating scale, medication(s)/side effects and non-pharmacologic comfort measures 08/24/2019 1530 by Roselyn Bering, RN Outcome: Progressing 08/24/2019 1530 by Roselyn Bering, RN Outcome: Progressing

## 2019-08-24 NOTE — Progress Notes (Signed)
Orthopedic Tech Progress Note Patient Details:  Andrew [name] Aug 12, 1990 [id number] Patient was given instructions on when to wear CAM Walker and when to wear PRAFO  Ortho Devices Type of Ortho Device: CAM walker Ortho Device/Splint Location: LLE Ortho Device/Splint Interventions: Ordered   Post Interventions Instructions Provided: Adjustment of device, Poper ambulation with device   Andrew [name] 08/24/2019, 7:47 PM

## 2019-08-24 NOTE — Progress Notes (Signed)
PROGRESS NOTE    Andrew [name]  ATF:[id number] DOB: [birth year] DOA: 08/21/2019 PCP: Patient, No Pcp Per    Chief Complaint  Patient presents with  . Hematuria  . Back Pain    Brief Narrative:  29 year old gentleman presented to ED for left lower extremity swelling and pain was found to have rhabdomyolysis with subacute compartment syndrome of the left lower leg probably secondary to narcotic overdose versus pressure ischemia.  Orthopedics consulted and recommended no surgical intervention at this time and to continue with neurovascular checks with Doppler. Pt able to flex and extend all toes on the left foot today, much improved when compared to yesterday. Pt wants to know when he can be discharged.  No nausea, vomiting, headache or dizziness.   Assessment & Plan:   Principal Problem:   Rhabdomyolysis Active Problems:   Left leg weakness   Cold left foot   Polysubstance abuse (HCC)  Rhabdomyolysis with sub acute compartment syndrome of the lft lower leg sec to narcotic overdose versus pressure ischemia. CK levels are improving. Continue with IV fluids at NS AT 75Ml/hr.  Orthopedics consulted recommended neurovascular checks with Doppler.  Recommended outpatient follow up with Dr Dion Saucier in the office.  PT evaluation recommending outpatient PT ON discharge.  Pt abel to move, flex and extend all toes today on the left side.  Pain control with oral oxy , will try to discontinue IV dilaudid today.    Polysubstance abuse TOC consulted for resources.   Syncope probably secondary to polysubstance abuse Echocardiogram shows  Left ventricular ejection fraction, by estimation, is 55 to 60%. The  left ventricle has normal function. The left ventricle has no regional  wall motion abnormalities. Left ventricular diastolic parameters were  Normal. Pt denies any dizziness.    Hyponatremia Resolved with IV fluids.  Hypokalemia Replaced.   Elevated liver enzymes probably  secondary to rhabdomyolysis Improving. Recommend checking liver enzymes in 2 weeks for resolution.       DVT prophylaxis: Heparin.  Code Status: FULL CODE.  Family Communication: none at bedside, .  Disposition:   Status is: Inpatient  Remains inpatient appropriate because:Ongoing diagnostic testing needed not appropriate for outpatient work up and IV treatments appropriate due to intensity of illness or inability to take PO   Dispo: The patient is from: Home              Anticipated d/c is to: pending.               Anticipated d/c date is: 1 day              Patient currently is not medically stable to d/c.       Consultants:   Orthopedics.    Procedures: echocardiogram  Left ventricular ejection fraction, by estimation, is 55 to 60%. The  left ventricle has normal function. The left ventricle has no regional  wall motion abnormalities. Left ventricular diastolic parameters were  normal.   Antimicrobials: none.    Subjective: Wants to know when he can go home, no ches tpain or sob, no nausea, vomiting or abd pain.  No headache or dizziness , no withdrawal symptoms.   Objective: Vitals:   08/23/19 1545 08/23/19 2049 08/24/19 0608 08/24/19 1146  BP:  (!) 147/93 (!) 146/88 134/74  Pulse:  74  62  Resp:  19 20 12   Temp: 98.8 F (37.1 C) 98.1 F (36.7 C) 99.1 F (37.3 C) 98.3 F (36.8 C)  TempSrc: Oral Axillary Oral Oral  SpO2:  99% 98% 100%  Weight:   132.4 kg   Height:        Intake/Output Summary (Last 24 hours) at 08/24/2019 1618 Last data filed at 08/24/2019 1351 Gross per 24 hour  Intake 480 ml  Output 2300 ml  Net -1820 ml   Filed Weights   08/21/19 1303 08/23/19 0444 08/24/19 4128  Weight: 117.9 kg 130 kg 132.4 kg    Examination:  General exam: alert and comfortable.  Respiratory system: clear to auscultation, no wheezing or rhonchi.  Cardiovascular system: S1s2 RRR, no JVD, Gastrointestinal system: Abdomen is soft nontender non  distended, bowel sounds wnl.  Central nervous system: Alert and oriented, grossly non focal. Extremities: swollen LLE, tender . Pt able to flex and extend the toes on the left.  Skin: No rashes seen.  Psychiatry:  Mood is appropriate.     Data Reviewed: I have personally reviewed following labs and imaging studies  CBC: Recent Labs  Lab 08/21/19 1338 08/22/19 0857  WBC 11.3* 8.7  NEUTROABS 8.5* 6.2  HGB 14.2 13.0  HCT 45.1 41.5  MCV 86.7 85.6  PLT 322 274    Basic Metabolic Panel: Recent Labs  Lab 08/21/19 1338 08/22/19 0857 08/23/19 0953 08/24/19 0848  NA 129* 135 135 137  K 3.3* 3.3* 3.6 3.8  CL 95* 98 94* 98  CO2 23 28 33* 31  GLUCOSE 94 110* 113* 127*  BUN 14 9 <5* 6  CREATININE 1.04 0.91 0.78 0.82  CALCIUM 8.1* 8.2* 8.4* 8.5*  MG  --  2.0  --   --   PHOS  --  3.3  --   --     GFR: Estimated Creatinine Clearance: 188.8 mL/min (by C-G formula based on SCr of 0.82 mg/dL).  Liver Function Tests: Recent Labs  Lab 08/21/19 1338 08/22/19 0857 08/23/19 0953  AST 1,424* 964*  959* 675*  ALT 270* 264*  262* 224*  ALKPHOS 82 64  66 57  BILITOT 0.7 0.4  0.5 0.7  PROT 7.9 6.1*  6.2* 6.1*  ALBUMIN 4.1 2.8*  2.7* 2.7*    CBG: Recent Labs  Lab 08/21/19 1323  GLUCAP 74     Recent Results (from the past 240 hour(s))  SARS Coronavirus 2 by RT PCR (hospital order, performed in [facility] hospital lab) Nasopharyngeal Nasopharyngeal Swab     Status: None   Collection Time: 08/21/19  4:34 PM   Specimen: Nasopharyngeal Swab  Result Value Ref Range Status   SARS Coronavirus 2 NEGATIVE NEGATIVE Final    Comment: (NOTE) SARS-CoV-2 target nucleic acids are NOT DETECTED.  The SARS-CoV-2 RNA is generally detectable in upper and lower respiratory specimens during the acute phase of infection. The lowest concentration of SARS-CoV-2 viral copies this assay can detect is 250 copies / mL. A negative result does not preclude SARS-CoV-2 infection and should not  be used as the sole basis for treatment or other patient management decisions.  A negative result may occur with improper specimen collection / handling, submission of specimen other than nasopharyngeal swab, presence of viral mutation(s) within the areas targeted by this assay, and inadequate number of viral copies (<250 copies / mL). A negative result must be combined with clinical observations, patient history, and epidemiological information.  Fact Sheet for Patients:   BoilerBrush.com.cy  Fact Sheet for Healthcare Providers: https://pope.com/  This test is not yet approved or  cleared by the [place] FDA and has been authorized for detection and/or diagnosis of  SARS-CoV-2 by FDA under an Emergency Use Authorization (EUA).  This EUA will remain in effect (meaning this test can be used) for the duration of the COVID-19 declaration under Section 564(b)(1) of the Act, 21 U.S.C. section 360bbb-3(b)(1), unless the authorization is terminated or revoked sooner.  Performed at [facility], [address] [place], [place] [address]          Radiology Studies: No results found.      Scheduled Meds: . folic acid  1 mg Oral Daily  . heparin injection (subcutaneous)  5,000 Units Subcutaneous Q8H  . LORazepam  0-4 mg Intravenous Q12H  . multivitamin with minerals  1 tablet Oral Daily  . thiamine  100 mg Oral Daily   Or  . thiamine  100 mg Intravenous Daily   Continuous Infusions: . sodium chloride 75 mL/hr at 08/24/19 0743     LOS: 3 days       Kathlen Mody, MD [facility]   To contact the attending provider between 7A-7P or the covering provider during after hours 7P-7A, please log into the web site [email or web address] and access using universal [place] password for that web site. If you do not have the password, please call the hospital operator.  08/24/2019, 4:18 PM

## 2019-08-24 NOTE — Progress Notes (Addendum)
     Subjective:    Patient reports no buttock pain but moderate low back pain located on the left side. No other complaints.  Objective:  PE: VITALS:   Vitals:   08/23/19 0444 08/23/19 1545 08/23/19 2049 08/24/19 0608  BP: (!) 147/101  (!) 147/93 (!) 146/88  Pulse: 84  74   Resp: 20  19 20   Temp: 98.2 F (36.8 C) 98.8 F (37.1 C) 98.1 F (36.7 C) 99.1 F (37.3 C)  TempSrc: Oral Oral Axillary Oral  SpO2: 99%  99% 98%  Weight: 130 kg   132.4 kg  Height:       General: Alert, oriented, in no acute distress MSK: Left buttock and thigh are soft to palpation. Continued swelling of left lower leg though mildly improved since admission.Faint DP pulse found with doppler, faint PT pulse found with doppler.Patient reports sensation just proximal to lateral mealleoulus. Able to flex and extend all toes of the left foot today. Able to dorsiflex approx 10 degrees with 1/5 strength, plantarflexion 10 degrees with 2/5 strength.    LABS  Results for orders placed or performed during the hospital encounter of 08/21/19 (from the past 24 hour(s))  Basic metabolic panel     Status: Abnormal   Collection Time: 08/24/19  8:48 AM  Result Value Ref Range   Sodium 137 135 - 145 mmol/L   Potassium 3.8 3.5 - 5.1 mmol/L   Chloride 98 98 - 111 mmol/L   CO2 31 22 - 32 mmol/L   Glucose, Bld 127 (H) 70 - 99 mg/dL   BUN 6 6 - 20 mg/dL   Creatinine, Ser 08/26/19 0.61 - 1.24 mg/dL   Calcium 8.5 (L) 8.9 - 10.3 mg/dL   GFR calc non Af Amer >60 >60 mL/min   GFR calc Af Amer >60 >60 mL/min   Anion gap 8 5 - 15    No results found.  Assessment/Plan:  Rhabdomyolysis withsubacute, chroniccompartment syndrome of left lower legsecondary to narcotic overdose, question pressure ischemia versus drug-induced ischemia - increased motion in ankle today - WBAT, up with therapy, will order walking boot to be worn when walking - patient can wear prafo boot at rest - ok to discharge when cleared by medicine -  will discharge with pain medication and plan for follow-up in 1 week with Dr. 3.50 information:   Weekdays 8-5 09-14-2005, PA-C [phone] A fter hours and holidays please check [email or web address] for group call information for [facility]  [phone] 08/24/2019, 11:36 AM

## 2019-08-24 NOTE — Progress Notes (Addendum)
CSW spoke with patient and patients mother at bedside. CSW offered Outpatient Substance use treatment services resources and Psychiatry resources. Patient accepted.  CSW will continue to follow.

## 2019-08-24 NOTE — Discharge Instructions (Signed)
Patient should keep foot in either prafo boot (at rest) or walking boot (when walking) at all times. Follow-up in Dr. Shelba Flake office in 1 week.

## 2019-08-25 ENCOUNTER — Inpatient Hospital Stay (HOSPITAL_COMMUNITY): Payer: Self-pay

## 2019-08-25 LAB — BASIC METABOLIC PANEL
Anion gap: 7 (ref 5–15)
BUN: 7 mg/dL (ref 6–20)
CO2: 28 mmol/L (ref 22–32)
Calcium: 8.8 mg/dL — ABNORMAL LOW (ref 8.9–10.3)
Chloride: 101 mmol/L (ref 98–111)
Creatinine, Ser: 0.76 mg/dL (ref 0.61–1.24)
GFR calc Af Amer: >60 mL/min (ref >60)
GFR calc non Af Amer: >60 mL/min (ref >60)
Glucose, Bld: 97 mg/dL (ref 70–99)
Potassium: 4.5 mmol/L (ref 3.5–5.1)
Sodium: 136 mmol/L (ref 135–145)

## 2019-08-25 LAB — CK: Total CK: 18993 U/L — ABNORMAL HIGH (ref 49–397)

## 2019-08-25 MED ORDER — METHOCARBAMOL 1000 MG/10ML IJ SOLN
500.0000 mg | Freq: Three times a day (TID) | INTRAVENOUS | Status: DC | PRN
  Filled 2019-08-25: qty 5

## 2019-08-25 NOTE — Progress Notes (Signed)
   ORTHOPAEDIC PROGRESS NOTE  s/p Rhabdo left lower extremity.    SUBJECTIVE: Reports no pain in left lower extremity just swelling and weakness that is improved. No chest pain. No SOB. No nausea/vomiting. No other complaints.  OBJECTIVE:  Left lower extremity still swollen.  Patient can actively plantar and dorsiflex ankle, FHL and EHL 5/5  2+ palpable pulses   Patient has good quad control and go hip flexors PE:  Vitals:   08/24/19 1146 08/24/19 2100  BP: 134/74 (!) 145/92  Pulse: 62 (!) 55  Resp: 12 18  Temp: 98.3 F (36.8 C) 98.2 F (36.8 C)  SpO2: 100% 99%     ASSESSMENT: Andrew [name] is a 29 y.o. male doing well postoperatively.  PLAN: Weightbearing: WBAT LLE with boot  Orthopedic device(s): None and CAM boot Showering: no restrictions VTE prophylaxis: heparin 5000 while in the hospital Pain control: oxycodone Follow - up plan: 1 week Contact information:   Call [phone] on Monday to make a follow up with Dr Andrew [name] Patient ID: Andrew [name], male   DOB: 1990/04/05, 29 y.o.   MRN: [id number]

## 2019-08-25 NOTE — Progress Notes (Signed)
PROGRESS NOTE    Andrew [name]  ION:[id number] DOB: 09/03/1990 DOA: 08/21/2019 PCP: Patient, No Pcp Per    Chief Complaint  Patient presents with  . Hematuria  . Back Pain    Brief Narrative:  29 year old gentleman presented to ED for left lower extremity swelling and pain was found to have rhabdomyolysis with subacute compartment syndrome of the left lower leg probably secondary to narcotic overdose versus pressure ischemia.  Orthopedics consulted and recommended no surgical intervention at this time and to continue with neurovascular checks with Doppler. Pt able to flex and extend all toes on the left foot. Pt seen and examined at bedside, with his dad by his side. He reports some back pain on the left.   Assessment & Plan:   Principal Problem:   Rhabdomyolysis Active Problems:   Left leg weakness   Cold left foot   Polysubstance abuse (HCC)  Rhabdomyolysis with sub acute compartment syndrome of the left lower leg sec to narcotic overdose versus pressure ischemia. CK levels are improving. Repeat levels ordered.  Continue with IV fluids at NS AT 75Ml/hr.  Orthopedics consulted recommended neurovascular checks with Doppler.  Recommended outpatient follow up with Dr Dion Saucier in the office on discharge. PT evaluation recommending outpatient PT . Pt reports he has steps at home and doesn't know if he can go up the steps and wants to work with PT prior to discharge home. PT re- evaluation ordered.    Polysubstance abuse TOC consulted for resources.   Syncope probably secondary to polysubstance abuse Echocardiogram shows  Left ventricular ejection fraction, by estimation, is 55 to 60%. The  left ventricle has normal function. The left ventricle has no regional  wall motion abnormalities. Left ventricular diastolic parameters were  Normal. Pt denies any dizziness. And no episodes of syncope.    Hyponatremia Resolved with IV fluids.  Hypokalemia Replaced.    Elevated liver  enzymes probably secondary to rhabdomyolysis Improving. Recommend checking liver enzymes in 2 weeks for resolution.    Low back pain:  Possibly musculoskeletal.  X rays of the lumbar spine ordered for further evaluation.    DVT prophylaxis: Heparin.  Code Status: FULL CODE.  Family Communication: none at bedside, .  Disposition:   Status is: Inpatient  Remains inpatient appropriate because:IV treatments appropriate due to intensity of illness or inability to take PO   Dispo: The patient is from: Home              Anticipated d/c is to: Home              Anticipated d/c date is: 1 day              Patient currently is not medically stable to d/c.       Consultants:   Orthopedics.    Procedures: echocardiogram  Left ventricular ejection fraction, by estimation, is 55 to 60%. The  left ventricle has normal function. The left ventricle has no regional  wall motion abnormalities. Left ventricular diastolic parameters were  normal.   Antimicrobials: none.    Subjective: No chest pain, or sob.  Low back pain, no nausea or vomiting.    Objective: Vitals:   08/24/19 0608 08/24/19 1146 08/24/19 2100 08/25/19 0602  BP: (!) 146/88 134/74 (!) 145/92 (!) 114/96  Pulse:  62 (!) 55 68  Resp: 20 12 18 18   Temp: 99.1 F (37.3 C) 98.3 F (36.8 C) 98.2 F (36.8 C) 98.3 F (36.8 C)  TempSrc: Oral Oral  Oral Oral  SpO2: 98% 100% 99% 100%  Weight: 132.4 kg     Height:        Intake/Output Summary (Last 24 hours) at 08/25/2019 1316 Last data filed at 08/25/2019 0939 Gross per 24 hour  Intake 1842 ml  Output 2150 ml  Net -308 ml   Filed Weights   08/21/19 1303 08/23/19 0444 08/24/19 3785  Weight: 117.9 kg 130 kg 132.4 kg    Examination:  General exam: alert and comfortable, not in distress.  Respiratory system: clear to auscultation, no wheezing heard.  Cardiovascular system: S1-S2 heard, regular rate rhythm, no JVD Gastrointestinal system: Abdomen is soft,  nontender, nondistended, bowel sounds normal..  Central nervous system: Alert and oriented, grossly nonfocal. Extremities: Left lower extremity swelling has improved and tenderness has improved.  Patient is able to flex and extend his toes on the left. Skin: No rashes seen. Psychiatry: Mood is appropriate   Data Reviewed: I have personally reviewed following labs and imaging studies  CBC: Recent Labs  Lab 08/21/19 1338 08/22/19 0857  WBC 11.3* 8.7  NEUTROABS 8.5* 6.2  HGB 14.2 13.0  HCT 45.1 41.5  MCV 86.7 85.6  PLT 322 274    Basic Metabolic Panel: Recent Labs  Lab 08/21/19 1338 08/22/19 0857 08/23/19 0953 08/24/19 0848 08/25/19 1206  NA 129* 135 135 137 136  K 3.3* 3.3* 3.6 3.8 4.5  CL 95* 98 94* 98 101  CO2 23 28 33* 31 28  GLUCOSE 94 110* 113* 127* 97  BUN 14 9 <5* 6 7  CREATININE 1.04 0.91 0.78 0.82 0.76  CALCIUM 8.1* 8.2* 8.4* 8.5* 8.8*  MG  --  2.0  --   --   --   PHOS  --  3.3  --   --   --     GFR: Estimated Creatinine Clearance: 193.5 mL/min (by C-G formula based on SCr of 0.76 mg/dL).  Liver Function Tests: Recent Labs  Lab 08/21/19 1338 08/22/19 0857 08/23/19 0953  AST 1,424* 964*  959* 675*  ALT 270* 264*  262* 224*  ALKPHOS 82 64  66 57  BILITOT 0.7 0.4  0.5 0.7  PROT 7.9 6.1*  6.2* 6.1*  ALBUMIN 4.1 2.8*  2.7* 2.7*    CBG: Recent Labs  Lab 08/21/19 1323  GLUCAP 74     Recent Results (from the past 240 hour(s))  SARS Coronavirus 2 by RT PCR (hospital order, performed in [facility] hospital lab) Nasopharyngeal Nasopharyngeal Swab     Status: None   Collection Time: 08/21/19  4:34 PM   Specimen: Nasopharyngeal Swab  Result Value Ref Range Status   SARS Coronavirus 2 NEGATIVE NEGATIVE Final    Comment: (NOTE) SARS-CoV-2 target nucleic acids are NOT DETECTED.  The SARS-CoV-2 RNA is generally detectable in upper and lower respiratory specimens during the acute phase of infection. The lowest concentration of SARS-CoV-2 viral  copies this assay can detect is 250 copies / mL. A negative result does not preclude SARS-CoV-2 infection and should not be used as the sole basis for treatment or other patient management decisions.  A negative result may occur with improper specimen collection / handling, submission of specimen other than nasopharyngeal swab, presence of viral mutation(s) within the areas targeted by this assay, and inadequate number of viral copies (<250 copies / mL). A negative result must be combined with clinical observations, patient history, and epidemiological information.  Fact Sheet for Patients:   BoilerBrush.com.cy  Fact Sheet for Healthcare Providers:  https://pope.com/  This test is not yet approved or  cleared by the [place] and has been authorized for detection and/or diagnosis of SARS-CoV-2 by FDA under an Emergency Use Authorization (EUA).  This EUA will remain in effect (meaning this test can be used) for the duration of the COVID-19 declaration under Section 564(b)(1) of the Act, 21 U.S.C. section 360bbb-3(b)(1), unless the authorization is terminated or revoked sooner.  Performed at [facility], [address], [place], [place] [address]          Radiology Studies: No results found.      Scheduled Meds: . folic acid  1 mg Oral Daily  . heparin injection (subcutaneous)  5,000 Units Subcutaneous Q8H  . LORazepam  0-4 mg Intravenous Q12H  . multivitamin with minerals  1 tablet Oral Daily  . thiamine  100 mg Oral Daily   Or  . thiamine  100 mg Intravenous Daily   Continuous Infusions: . sodium chloride 75 mL/hr at 08/25/19 1124  . methocarbamol (ROBAXIN) IV       LOS: 4 days       Kathlen Mody, MD [facility]   To contact the attending provider between 7A-7P or the covering provider during after hours 7P-7A, please log into the web site [email or web address] and access using universal  [place] password for that web site. If you do not have the password, please call the hospital operator.  08/25/2019, 1:16 PM

## 2019-08-25 NOTE — Progress Notes (Signed)
Physical Therapy Treatment Patient Details Name: Andrew [name] MRN: [id number] DOB: [birth year] Today's Date: 08/25/2019    History of Present Illness 29 y.o. male with history of depression and polysubstance abuse who is just out of the jail about a month ago and has been drinking alcohol every day admits to taking ecstasy a day before coming to the ER had a syncopal episode at home following which patient started noticing he was weakness both lower extremities when he woke up.  He does not remember how long he was on the floor.  His right lower extremity gained strength soon but his left lower extremity was getting more progressively swollen and weak and decreased strength.  He came to the ER. In the ER it was noticed that patient's left lower extremity was swollen cold to touch but had pulses which were dopplerable. Pt found to have rhabdomyolysis of L LE.     PT Comments    Pt was seen for mobility on stairs after warming up with walking.  He is in Cam boot with better ankle control and comfort, and was instructed on safety, use of stair rails, choices of movement to remain safe and was encouraged to have assistance for any moving.  His father is available, which seems advisable for a safe stay.  See acutely for progression of gait as pt is able to be seen.     Follow Up Recommendations  Outpatient PT     Equipment Recommendations  Rolling walker with 5" wheels;Other (comment)    Recommendations for Other Services       Precautions / Restrictions Precautions Precautions: Fall Precaution Comments: cam boot on L foot out of bed to stand and walk Required Braces or Orthoses: Other Brace Other Brace: PRAFO for in bed on L foot Restrictions Weight Bearing Restrictions: Yes LLE Weight Bearing: Weight bearing as tolerated    Mobility  Bed Mobility Overal bed mobility: Modified Independent             General bed mobility comments: supervision recommended  Transfers Overall  transfer level: Needs assistance Equipment used: Rolling walker (2 wheeled) Transfers: Sit to/from Stand Sit to Stand: Min guard         General transfer comment: for safety  Ambulation/Gait Ambulation/Gait assistance: Min guard Gait Distance (Feet): 50 Feet Assistive device: Rolling walker (2 wheeled) Gait Pattern/deviations: Step-through pattern;Decreased stride length;Wide base of support;Decreased weight shift to left Gait velocity: variable  Gait velocity interpretation: <1.31 ft/sec, indicative of household ambulator General Gait Details: encouraged pt to slow down   Stairs Stairs: Yes Stairs assistance: Min assist;Min guard Stair Management: One rail Left;One rail Right;Step to pattern;Forwards Number of Stairs: 12 General stair comments: used two patterns, with one rail and with two   Wheelchair Mobility    Modified Rankin (Stroke Patients Only)       Balance Overall balance assessment: Needs assistance Sitting-balance support: Feet supported Sitting balance-Leahy Scale: Good     Standing balance support: Single extremity supported;Bilateral upper extremity supported;During functional activity Standing balance-Leahy Scale: Fair Standing balance comment: less than fair dynamically                            Cognition Arousal/Alertness: Awake/alert Behavior During Therapy: Impulsive;Flat affect Overall Cognitive Status: Impaired/Different from baseline Area of Impairment: Orientation;Attention;Memory;Following commands;Safety/judgement;Awareness;Problem solving                 Orientation Level: Situation Current Attention Level: Sustained;Selective Memory:  Decreased recall of precautions Following Commands: Follows one step commands inconsistently;Follows one step commands with increased time Safety/Judgement: Decreased awareness of safety;Decreased awareness of deficits Awareness: Intellectual Problem Solving: Requires verbal  cues;Requires tactile cues General Comments: pt is motivated to wear LLE cam boot      Exercises Other Exercises Other Exercises: DF stretches    General Comments General comments (skin integrity, edema, etc.): cam boot is on LLE with time OOB and is comfortable with pt understanding the purpose quickly      Pertinent Vitals/Pain Pain Assessment: 0-10 Pain Score: 4  Pain Location: back pre gait Pain Intervention(s): Monitored during session;Premedicated before session    Home Living                      Prior Function            PT Goals (current goals can now be found in the care plan section) Acute Rehab PT Goals Patient Stated Goal: get back to work PT Goal Formulation: With patient Progress towards PT goals: Progressing toward goals    Frequency    Min 3X/week      PT Plan Current plan remains appropriate    Co-evaluation              AM-PAC PT "6 Clicks" Mobility   Outcome Measure  Help needed turning from your back to your side while in a flat bed without using bedrails?: None Help needed moving from lying on your back to sitting on the side of a flat bed without using bedrails?: None Help needed moving to and from a bed to a chair (including a wheelchair)?: A Little Help needed standing up from a chair using your arms (e.g., wheelchair or bedside chair)?: A Little Help needed to walk in hospital room?: A Little Help needed climbing 3-5 steps with a railing? : Total 6 Click Score: 18    End of Session Equipment Utilized During Treatment: Gait belt Activity Tolerance: Patient tolerated treatment well Patient left: in bed;with call bell/phone within reach;with bed alarm set;with family/visitor present Nurse Communication: Mobility status;Patient requests pain meds PT Visit Diagnosis: Unsteadiness on feet (R26.81);Other abnormalities of gait and mobility (R26.89);Muscle weakness (generalized) (M62.81);Difficulty in walking, not elsewhere  classified (R26.2);Other symptoms and signs involving the nervous system (R29.898);Pain Pain - Right/Left: Left Pain - part of body: Ankle and joints of foot     Time: 1459-1543 PT Time Calculation (min) (ACUTE ONLY): 44 min  Charges:  $Gait Training: 8-22 mins $Therapeutic Activity: 8-22 mins                 Ivar Drape 08/25/2019, 9:38 PM  Samul Dada, PT MS Acute Rehab Dept. Number: [facility] [id number] and [employer] [phone]

## 2019-08-26 MED ORDER — THIAMINE HCL 100 MG PO TABS: 100.0000 mg | ORAL_TABLET | Freq: Every day | ORAL | Status: DC

## 2019-08-26 MED ORDER — FOLIC ACID 1 MG PO TABS: 1.0000 mg | ORAL_TABLET | Freq: Every day | ORAL | 0 refills | Status: DC

## 2019-08-26 MED ORDER — ADULT MULTIVITAMIN W/MINERALS CH: 1.0000 | ORAL_TABLET | Freq: Every day | ORAL | Status: DC

## 2019-08-26 NOTE — Care Management (Signed)
Patient states he will purchase a walker elsewhere.  OPPT referral entered.

## 2019-08-26 NOTE — Discharge Summary (Signed)
Physician Discharge Summary  Hau Sanor GEX:[id number] DOB: [birth year] DOA: 08/21/2019  PCP: Patient, No Pcp Per  Admit date: 08/21/2019 Discharge date: 08/26/2019  Admitted From: Home.  Disposition:  Home.   Recommendations for Outpatient Follow-up:  1. Follow up with PCP in 1-2 weeks 2. Please obtain BMP/CBC in one week Please follow up with orthopedics as recommended.    Discharge Condition: guarded.  CODE STATUS: full code.  Diet recommendation: Heart Healthy   Brief/Interim Summary: 29 year old gentleman presented to ED for left lower extremity swelling and pain was found to have rhabdomyolysis with subacute compartment syndrome of the left lower leg probably secondary to narcotic overdose versus pressure ischemia. Orthopedics consulted and recommended no surgical intervention at this time and to continue with neurovascular checks with Doppler. Pt able to flex and extend all toes on the left foot. Pt seen and examined at bedside, with his dad by his side. He reports some back pain on the left.   Discharge Diagnoses:  Principal Problem:   Rhabdomyolysis Active Problems:   Left leg weakness   Cold left foot   Polysubstance abuse (HCC)  Rhabdomyolysis with sub acute compartment syndrome of the left lower leg sec to narcotic overdose versus pressure ischemia. CK levels are improving. Repeat levels ordered.  Continue with IV fluids at NS AT 75Ml/hr.  Orthopedics consulted recommended neurovascular checks with Doppler.  Recommended outpatient follow up with Dr Dion Saucier in the office on discharge. PT evaluation recommending outpatient PT . Pt reports he has steps at home and doesn't know if he can go up the steps and wants to work with PT prior to discharge home. PT re- evaluation ordered, recommended outpatient PT.  PT REPORTS his leg pain and swelling has improved, almost resolved. And he is able to bear weight on it.    Polysubstance abuse TOC consulted for  resources.   Syncope probably secondary to polysubstance abuse Echocardiogram shows  Left ventricular ejection fraction, by estimation, is 55 to 60%. The  left ventricle has normal function. The left ventricle has no regional  wall motion abnormalities. Left ventricular diastolic parameters were  Normal. Pt denies any dizziness. And no episodes of syncope.    Hyponatremia Resolved with IV fluids.  Hypokalemia Replaced.    Elevated liver enzymes probably secondary to rhabdomyolysis Improving. Recommend checking liver enzymes in 2 weeks for resolution.    Low back pain:  Possibly musculoskeletal.  X rays of the lumbar spine ordered for further evaluation, it was negative for acute changes.    Discharge Instructions  Discharge Instructions    Diet - low sodium heart healthy   Complete by: As directed    Discharge instructions   Complete by: As directed    Please follow up with orthopedics as scheduled.     Allergies as of 08/26/2019      Reactions   Haloperidol Lactate Other (See Comments)   Penicillins Hives      Medication List    TAKE these medications   folic acid 1 MG tablet Commonly known as: FOLVITE Take 1 tablet (1 mg total) by mouth daily. Start taking on: August 27, 2019   mirtazapine 15 MG disintegrating tablet Commonly known as: REMERON SOL-TAB Take 15 mg by mouth at bedtime as needed.   multivitamin with minerals Tabs tablet Take 1 tablet by mouth daily. Start taking on: August 27, 2019   oxyCODONE 5 MG immediate release tablet Commonly known as: Roxicodone Take 1 tablet (5 mg total) by mouth every 4 (  four) hours as needed for severe pain.   thiamine 100 MG tablet Take 1 tablet (100 mg total) by mouth daily. Start taking on: August 27, 2019       Follow-up Information    Teryl Lucy, MD. Schedule an appointment as soon as possible for a visit in 1 week(s).   Specialty: Orthopedic Surgery Contact information: [address] [place] [address] [phone]              Allergies  Allergen Reactions  . Haloperidol Lactate Other (See Comments)  . Penicillins Hives    Consultations:  orthopedics   Procedures/Studies: DG Lumbar Spine 2-3 Views  Result Date: 08/25/2019 CLINICAL DATA:  Back pain after fall 4 days ago. EXAM: LUMBAR SPINE - 2-3 VIEW COMPARISON:  Report from lumbar spine radiographs dated 12/01/2001. FINDINGS: There is no evidence of lumbar spine fracture. Alignment is normal. Intervertebral disc spaces are maintained. IMPRESSION: Negative. Electronically Signed   By: Romona Curls M.D.   On: 08/25/2019 15:33   CT ANGIO AO+BIFEM W & OR WO CONTRAST  Result Date: 08/21/2019 CLINICAL DATA:  Arterial embolization. Back pain and left leg pain and swelling. EXAM: CT ANGIOGRAPHY OF ABDOMINAL AORTA WITH ILIOFEMORAL RUNOFF TECHNIQUE: Multidetector CT imaging of the abdomen, pelvis and lower extremities was performed using the standard protocol during bolus administration of intravenous contrast. Multiplanar CT image reconstructions and MIPs were obtained to evaluate the vascular anatomy. CONTRAST:  OMNIPAQUE IOHEXOL 350 MG/ML SOLN COMPARISON:  None. FINDINGS: VASCULAR Aorta: Normal caliber aorta without aneurysm, dissection, vasculitis or significant stenosis. Celiac: Patent without evidence of aneurysm, dissection, vasculitis or significant stenosis. SMA: Patent without evidence of aneurysm, dissection, vasculitis or significant stenosis. Renals: Both renal arteries are patent without evidence of aneurysm, dissection, vasculitis, fibromuscular dysplasia or significant stenosis. IMA: Patent without evidence of aneurysm, dissection, vasculitis or significant stenosis. RIGHT Lower Extremity Inflow: Common, internal and external iliac arteries are patent without evidence of aneurysm, dissection, vasculitis or significant stenosis. Outflow: Common, superficial and profunda femoral  arteries and the popliteal artery are patent without evidence of aneurysm, dissection, vasculitis or significant stenosis. Runoff: There appears to be a 3 vessel runoff to the level ankle, however evaluation is limited by venous contamination. LEFT Lower Extremity Inflow: Common, internal and external iliac arteries are patent without evidence of aneurysm, dissection, vasculitis or significant stenosis. Outflow: Common, superficial and profunda femoral arteries and the popliteal artery are patent without evidence of aneurysm, dissection, vasculitis or significant stenosis. Runoff: There appears to be a 3 vessel runoff to the level of the ankle, however evaluation is limited by venous contamination. Veins: No obvious venous abnormality within the limitations of this arterial phase study. Review of the MIP images confirms the above findings. NON-VASCULAR Lower chest: The lung bases are clear. The heart size is normal. Hepatobiliary: The liver is normal. Normal gallbladder.There is no biliary ductal dilation. Pancreas: Normal contours without ductal dilatation. No peripancreatic fluid collection. Spleen: Unremarkable. Adrenals/Urinary Tract: --Adrenal glands: Unremarkable. --Right kidney/ureter: No hydronephrosis or radiopaque kidney stones. --Left kidney/ureter: No hydronephrosis or radiopaque kidney stones. --Urinary bladder: Unremarkable. Stomach/Bowel: --Stomach/Duodenum: No hiatal hernia or other gastric abnormality. Normal duodenal course and caliber. --Small bowel: Unremarkable. --Colon: Unremarkable. --Appendix: Normal. Lymphatic: --No retroperitoneal lymphadenopathy. --No mesenteric lymphadenopathy. --No pelvic or inguinal lymphadenopathy. Reproductive: Unremarkable Other: No ascites or free air. The abdominal wall is normal. Musculoskeletal. There is hypoenhancement enlargement of the bilateral operator externus muscles as well as the left gluteal musculature and  muscles of the anterior compartment of the  proximal left thigh. There is asymmetric enlargement of the left soleus muscle which is also hypoenhancing. There is no rim enhancing fluid collection. There appears to be a soft tissue contusion involving the medial right lower extremity at the level of the mid tibia. There are small bilateral suprapatellar joint effusions. IMPRESSION: 1. No evidence for an acute arterial abnormality. There appears to be a 3 vessel runoff to both ankles, however evaluation is limited by venous contamination. 2. Enlargement and hypoenhancement of multiple muscles of the left lower extremity as detailed above. This is a nonspecific finding. Differential considerations include rhabdomyolysis, myositis/pyomyositis, versus less likely autoimmune myositis or necrotizing fasciitis. Electronically Signed   By: Katherine Mantle M.D.   On: 08/21/2019 16:16   US Venous Img Lower Unilateral Left (DVT)  Result Date: 08/21/2019 CLINICAL DATA:  Left leg swelling EXAM: LEFT LOWER EXTREMITY VENOUS DOPPLER ULTRASOUND TECHNIQUE: Gray-scale sonography with compression, as well as color and duplex ultrasound, were performed to evaluate the deep venous system(s) from the level of the common femoral vein through the popliteal and proximal calf veins. COMPARISON:  None. FINDINGS: VENOUS Normal compressibility of the common femoral, superficial femoral, and popliteal veins, as well as the visualized calf veins. Visualized portions of profunda femoral vein and great saphenous vein unremarkable. No filling defects to suggest DVT on grayscale or color Doppler imaging. Doppler waveforms show normal direction of venous flow, normal respiratory plasticity and response to augmentation. Limited views of the contralateral common femoral vein are unremarkable. OTHER None. Limitations: none IMPRESSION: Negative. Electronically Signed   By: Katherine Mantle M.D.   On: 08/21/2019 15:07   ECHOCARDIOGRAM COMPLETE  Result Date: 08/22/2019    ECHOCARDIOGRAM  REPORT   Patient Name:   DENIEL MCQUISTON Date of Exam: 08/22/2019 Medical Rec #:  [id number]       Height:       72.0 in Accession #:    [id number]      Weight:       260.0 lb Date of Birth:  02-10-1990        BSA:          2.382 m Patient Age:    28 years        BP:           148/94 mmHg Patient Gender: M               HR:           82 bpm. Exam Location:  Inpatient Procedure: 2D Echo Indications:    Syncope R55  History:        Patient has no prior history of Echocardiogram examinations.  Sonographer:    Thurman Coyer RDCS (AE) Referring Phys: [id number] Ready [facility] IMPRESSIONS  1. Left ventricular ejection fraction, by estimation, is 55 to 60%. The left ventricle has normal function. The left ventricle has no regional wall motion abnormalities. Left ventricular diastolic parameters were normal.  2. Right ventricular systolic function is normal. The right ventricular size is normal. Tricuspid regurgitation signal is inadequate for assessing PA pressure.  3. The mitral valve is normal in structure. No evidence of mitral valve regurgitation. No evidence of mitral stenosis.  4. The aortic valve is tricuspid. Aortic valve regurgitation is not visualized. No aortic stenosis is present.  5. The inferior vena cava is normal in size with greater than 50% respiratory variability, suggesting right atrial pressure of 3 mmHg. FINDINGS  Left Ventricle: Left  ventricular ejection fraction, by estimation, is 55 to 60%. The left ventricle has normal function. The left ventricle has no regional wall motion abnormalities. The left ventricular internal cavity size was normal in size. There is  no left ventricular hypertrophy. Left ventricular diastolic parameters were normal. Right Ventricle: The right ventricular size is normal. No increase in right ventricular wall thickness. Right ventricular systolic function is normal. Tricuspid regurgitation signal is inadequate for assessing PA pressure. Left Atrium: Left atrial size was  normal in size. Right Atrium: Right atrial size was normal in size. Pericardium: Trivial pericardial effusion is present. Mitral Valve: The mitral valve is normal in structure. No evidence of mitral valve regurgitation. No evidence of mitral valve stenosis. Tricuspid Valve: The tricuspid valve is normal in structure. Tricuspid valve regurgitation is not demonstrated. Aortic Valve: The aortic valve is tricuspid. Aortic valve regurgitation is not visualized. No aortic stenosis is present. Pulmonic Valve: The pulmonic valve was normal in structure. Pulmonic valve regurgitation is not visualized. Aorta: The aortic root is normal in size and structure. Venous: The inferior vena cava is normal in size with greater than 50% respiratory variability, suggesting right atrial pressure of 3 mmHg. IAS/Shunts: No atrial level shunt detected by color flow Doppler.  LEFT VENTRICLE PLAX 2D LVIDd:         4.70 cm      Diastology LVIDs:         3.00 cm      LV e' lateral:   9.14 cm/s LV PW:         1.20 cm      LV E/e' lateral: 6.0 LV IVS:        1.10 cm      LV e' medial:    8.81 cm/s LVOT diam:     2.00 cm      LV E/e' medial:  6.2 LV SV:         53 LV SV Index:   22 LVOT Area:     3.14 cm  LV Volumes (MOD) LV vol d, MOD A2C: 108.0 ml LV vol d, MOD A4C: 110.0 ml LV vol s, MOD A2C: 52.5 ml LV vol s, MOD A4C: 54.6 ml LV SV MOD A2C:     55.5 ml LV SV MOD A4C:     110.0 ml LV SV MOD BP:      55.6 ml RIGHT VENTRICLE RV S prime:     10.60 cm/s TAPSE (M-mode): 1.8 cm LEFT ATRIUM             Index       RIGHT ATRIUM           Index LA diam:        3.70 cm 1.55 cm/m  RA Area:     12.30 cm LA Vol (A2C):   41.2 ml 17.30 ml/m RA Volume:   24.30 ml  10.20 ml/m LA Vol (A4C):   23.3 ml 9.78 ml/m LA Biplane Vol: 31.9 ml 13.39 ml/m  AORTIC VALVE LVOT Vmax:   104.00 cm/s LVOT Vmean:  67.800 cm/s LVOT VTI:    0.168 m  AORTA Ao Root diam: 3.10 cm MITRAL VALVE MV Area (PHT): 3.19 cm    SHUNTS MV Decel Time: 238 msec    Systemic VTI:  0.17 m MV E  velocity: 54.80 cm/s  Systemic Diam: 2.00 cm MV A velocity: 52.70 cm/s MV E/A ratio:  1.04 Marca Ancona MD Electronically signed by Marca Ancona MD Signature Date/Time: 08/22/2019/4:09:34 PM  Final        Subjective: NO NEW COMPLAINTS.   Discharge Exam: Vitals:   08/26/19 0037 08/26/19 0626  BP: 137/77 137/76  Pulse: 71 64  Resp: 18 18  Temp: 98.7 F (37.1 C) 98.1 F (36.7 C)  SpO2: 99% 100%   Vitals:   08/25/19 1400 08/25/19 2220 08/26/19 0037 08/26/19 0626  BP: 140/80 130/79 137/77 137/76  Pulse: 77 78 71 64  Resp: 16 16 18 18   Temp: 98.6 F (37 C) 98.2 F (36.8 C) 98.7 F (37.1 C) 98.1 F (36.7 C)  TempSrc: Skin Oral Oral Oral  SpO2: 100% 100% 99% 100%  Weight:      Height:        General: Pt is alert, awake, not in acute distress Cardiovascular: RRR, S1/S2 +, no rubs, no gallops Respiratory: CTA bilaterally, no wheezing, no rhonchi Abdominal: Soft, NT, ND, bowel sounds + Extremities: no edema, no cyanosis    The results of significant diagnostics from this hospitalization (including imaging, microbiology, ancillary and laboratory) are listed below for reference.     Microbiology: Recent Results (from the past 240 hour(s))  SARS Coronavirus 2 by RT PCR (hospital order, performed in [facility] lab) Nasopharyngeal Nasopharyngeal Swab     Status: None   Collection Time: 08/21/19  4:34 PM   Specimen: Nasopharyngeal Swab  Result Value Ref Range Status   SARS Coronavirus 2 NEGATIVE NEGATIVE Final    Comment: (NOTE) SARS-CoV-2 target nucleic acids are NOT DETECTED.  The SARS-CoV-2 RNA is generally detectable in upper and lower respiratory specimens during the acute phase of infection. The lowest concentration of SARS-CoV-2 viral copies this assay can detect is 250 copies / mL. A negative result does not preclude SARS-CoV-2 infection and should not be used as the sole basis for treatment or other patient management decisions.  A negative result  may occur with improper specimen collection / handling, submission of specimen other than nasopharyngeal swab, presence of viral mutation(s) within the areas targeted by this assay, and inadequate number of viral copies (<250 copies / mL). A negative result must be combined with clinical observations, patient history, and epidemiological information.  Fact Sheet for Patients:   BoilerBrush.com.cyhttps://www.fda.gov/media/136312/download  Fact Sheet for Healthcare Providers: https://pope.com/https://www.fda.gov/media/136313/download  This test is not yet approved or  cleared by the [place] FDA and has been authorized for detection and/or diagnosis of SARS-CoV-2 by FDA under an Emergency Use Authorization (EUA).  This EUA will remain in effect (meaning this test can be used) for the duration of the COVID-19 declaration under Section 564(b)(1) of the Act, 21 U.S.C. section 360bbb-3(b)(1), unless the authorization is terminated or revoked sooner.  Performed at [facility] [place], [address] [place], [place] [address]      Labs: BNP (last 3 results) No results for input(s): BNP in the last 8760 hours. Basic Metabolic Panel: Recent Labs  Lab 08/21/19 1338 08/22/19 0857 08/23/19 0953 08/24/19 0848 08/25/19 1206  NA 129* 135 135 137 136  K 3.3* 3.3* 3.6 3.8 4.5  CL 95* 98 94* 98 101  CO2 23 28 33* 31 28  GLUCOSE 94 110* 113* 127* 97  BUN 14 9 <5* 6 7  CREATININE 1.04 0.91 0.78 0.82 0.76  CALCIUM 8.1* 8.2* 8.4* 8.5* 8.8*  MG  --  2.0  --   --   --   PHOS  --  3.3  --   --   --    Liver Function  Tests: Recent Labs  Lab 08/21/19 1338 08/22/19 0857 08/23/19 0953  AST 1,424* 964*  959* 675*  ALT 270* 264*  262* 224*  ALKPHOS 82 64  66 57  BILITOT 0.7 0.4  0.5 0.7  PROT 7.9 6.1*  6.2* 6.1*  ALBUMIN 4.1 2.8*  2.7* 2.7*   No results for input(s): LIPASE, AMYLASE in the last 168 hours. No results for input(s): AMMONIA in the last 168 hours. CBC: Recent Labs  Lab 08/21/19 1338  08/22/19 0857  WBC 11.3* 8.7  NEUTROABS 8.5* 6.2  HGB 14.2 13.0  HCT 45.1 41.5  MCV 86.7 85.6  PLT 322 274   Cardiac Enzymes: Recent Labs  Lab 08/21/19 1338 08/22/19 0857 08/23/19 0953 08/24/19 0848 08/25/19 1206  CKTOTAL >50,000* >50,000* 25,506* 26,399* 18,993*   BNP: Invalid input(s): POCBNP CBG: Recent Labs  Lab 08/21/19 1323  GLUCAP 74   D-Dimer No results for input(s): DDIMER in the last 72 hours. Hgb A1c No results for input(s): HGBA1C in the last 72 hours. Lipid Profile No results for input(s): CHOL, HDL, LDLCALC, TRIG, CHOLHDL, LDLDIRECT in the last 72 hours. Thyroid function studies No results for input(s): TSH, T4TOTAL, T3FREE, THYROIDAB in the last 72 hours.  Invalid input(s): FREET3 Anemia work up No results for input(s): VITAMINB12, FOLATE, FERRITIN, TIBC, IRON, RETICCTPCT in the last 72 hours. Urinalysis    Component Value Date/Time   COLORURINE BROWN (A) 08/21/2019 1504   APPEARANCEUR HAZY (A) 08/21/2019 1504   LABSPEC 1.020 08/21/2019 1504   PHURINE 6.0 08/21/2019 1504   GLUCOSEU (A) 08/21/2019 1504    TEST NOT REPORTED DUE TO COLOR INTERFERENCE OF URINE PIGMENT   HGBUR (A) 08/21/2019 1504    TEST NOT REPORTED DUE TO COLOR INTERFERENCE OF URINE PIGMENT   BILIRUBINUR (A) 08/21/2019 1504    TEST NOT REPORTED DUE TO COLOR INTERFERENCE OF URINE PIGMENT   KETONESUR (A) 08/21/2019 1504    TEST NOT REPORTED DUE TO COLOR INTERFERENCE OF URINE PIGMENT   PROTEINUR (A) 08/21/2019 1504    TEST NOT REPORTED DUE TO COLOR INTERFERENCE OF URINE PIGMENT   UROBILINOGEN 0.2 05/13/2014 0642   NITRITE (A) 08/21/2019 1504    TEST NOT REPORTED DUE TO COLOR INTERFERENCE OF URINE PIGMENT   LEUKOCYTESUR (A) 08/21/2019 1504    TEST NOT REPORTED DUE TO COLOR INTERFERENCE OF URINE PIGMENT   Sepsis Labs Invalid input(s): PROCALCITONIN,  WBC,  LACTICIDVEN Microbiology Recent Results (from the past 240 hour(s))  SARS Coronavirus 2 by RT PCR (hospital order,  performed in [facility] hospital lab) Nasopharyngeal Nasopharyngeal Swab     Status: None   Collection Time: 08/21/19  4:34 PM   Specimen: Nasopharyngeal Swab  Result Value Ref Range Status   SARS Coronavirus 2 NEGATIVE NEGATIVE Final    Comment: (NOTE) SARS-CoV-2 target nucleic acids are NOT DETECTED.  The SARS-CoV-2 RNA is generally detectable in upper and lower respiratory specimens during the acute phase of infection. The lowest concentration of SARS-CoV-2 viral copies this assay can detect is 250 copies / mL. A negative result does not preclude SARS-CoV-2 infection and should not be used as the sole basis for treatment or other patient management decisions.  A negative result may occur with improper specimen collection / handling, submission of specimen other than nasopharyngeal swab, presence of viral mutation(s) within the areas targeted by this assay, and inadequate number of viral copies (<250 copies / mL). A negative result must be combined with clinical observations, patient history, and epidemiological information.  Fact Sheet for Patients:   BoilerBrush.com.cy  Fact Sheet for Healthcare Providers: https://pope.com/  This test is not yet approved or  cleared by the [place] FDA and has been authorized for detection and/or diagnosis of SARS-CoV-2 by FDA under an Emergency Use Authorization (EUA).  This EUA will remain in effect (meaning this test can be used) for the duration of the COVID-19 declaration under Section 564(b)(1) of the Act, 21 U.S.C. section 360bbb-3(b)(1), unless the authorization is terminated or revoked sooner.  Performed at [facility], [address], [place], [place] [address]      Time coordinating discharge: 73 MINUTES.   SIGNED:   Kathlen Mody, MD  [facility] 08/26/2019, 1:20 PM

## 2019-08-27 MED FILL — FOLIC ACID 1 MG TABS: 1 | 30 days supply | Qty: 30 | Fill #0

## 2019-09-27 ENCOUNTER — Emergency Department (HOSPITAL_BASED_OUTPATIENT_CLINIC_OR_DEPARTMENT_OTHER)
Admission: EM | Admit: 2019-09-27 | Discharge: 2019-09-27 | Disposition: A | Discharge status: Home / Self Care | Payer: Self-pay | Attending: Emergency Medicine | Admitting: Emergency Medicine

## 2019-09-27 ENCOUNTER — Encounter (HOSPITAL_BASED_OUTPATIENT_CLINIC_OR_DEPARTMENT_OTHER): Payer: Self-pay | Admitting: *Deleted

## 2019-09-27 ENCOUNTER — Other Ambulatory Visit: Payer: Self-pay

## 2019-09-27 DIAGNOSIS — Z79899 Other long term (current) drug therapy: Secondary | ICD-10-CM | POA: Insufficient documentation

## 2019-09-27 DIAGNOSIS — G5792 Unspecified mononeuropathy of left lower limb: Secondary | ICD-10-CM | POA: Insufficient documentation

## 2019-09-27 DIAGNOSIS — F17210 Nicotine dependence, cigarettes, uncomplicated: Secondary | ICD-10-CM | POA: Insufficient documentation

## 2019-09-27 MED ORDER — GABAPENTIN 100 MG PO CAPS
100.0000 mg | ORAL_CAPSULE | Freq: Once | ORAL | Status: AC
  Administered 2019-09-27: 100 mg via ORAL
  Filled 2019-09-27: qty 1

## 2019-09-27 MED ORDER — GABAPENTIN 100 MG PO CAPS
100.0000 mg | ORAL_CAPSULE | Freq: Three times a day (TID) | ORAL | 0 refills | Status: DC
Start: 2019-09-27 — End: 2019-10-29

## 2019-09-27 NOTE — ED Notes (Signed)
Pt discharged to home. Discharge instructions have been discussed with patient and/or family members. Pt verbally acknowledges understanding d/c instructions, and endorses comprehension to checkout at registration before leaving.  °

## 2019-09-27 NOTE — ED Provider Notes (Signed)
[facility] EMERGENCY DEPARTMENT Provider Note  CSN: [id number] Arrival date & time: 09/27/19 1443    History Chief Complaint  Patient presents with  . Foot Pain    HPI  Andrew [name] is a 29 y.o. male here for evaluation of burning pain in his L foot. He has had some tingling in that leg for a few weeks but the burning foot pain began 3 days ago. He was admitted about 5-6 weeks ago for LLE edema/pain and found to have rhabdomyolysis and subactue/chronic compartment syndrome. Seen by Ortho during that admission but no surgical intervention was recommended. He improved during his hospitalization, provided with a Cam walker but did not followup due to financial concerns. He denies any fever, reports swelling is improved. Urine has been clear.    Past Medical History:  Diagnosis Date  . Chronic back pain   . Depression     History reviewed. No pertinent surgical history.  Family History  Family history unknown: Yes    Social History   Tobacco Use  . Smoking status: Current Every Day Smoker    Packs/day: 1.00    Types: Cigarettes  . Smokeless tobacco: Never Used  Substance Use Topics  . Alcohol use: Yes    Comment: occassional  . Drug use: Yes    Types: Marijuana    Comment: occassional     Home Medications Prior to Admission medications   Medication Sig Start Date End Date Taking? Authorizing Provider  folic acid (FOLVITE) 1 MG tablet Take 1 tablet (1 mg total) by mouth daily. 08/27/19   Kathlen Mody, MD  gabapentin (NEURONTIN) 100 MG capsule Take 1 capsule (100 mg total) by mouth 3 (three) times daily. 09/27/19   Pollyann Savoy, MD  mirtazapine (REMERON SOL-TAB) 15 MG disintegrating tablet Take 15 mg by mouth at bedtime as needed.    [provider]  Multiple Vitamin (MULTIVITAMIN WITH MINERALS) TABS tablet Take 1 tablet by mouth daily. 08/27/19   Kathlen Mody, MD  oxyCODONE (ROXICODONE) 5 MG immediate release tablet Take 1 tablet  (5 mg total) by mouth every 4 (four) hours as needed for severe pain. 08/24/19   Armida Sans, PA-C  thiamine 100 MG tablet Take 1 tablet (100 mg total) by mouth daily. 08/27/19   Kathlen Mody, MD     Allergies    Haloperidol lactate and Penicillins   Review of Systems   Review of Systems A comprehensive review of systems was completed and negative except as noted in HPI.    Physical Exam BP (!) 128/102 (BP Location: Right Arm)   Pulse 98   Temp 98.5 F (36.9 C) (Oral)   Resp 18   Ht 6' (1.829 m)   Wt 132 kg   SpO2 99%   BMI 39.47 kg/m   Physical Exam Vitals and nursing note reviewed.  Constitutional:      Appearance: Normal appearance.  HENT:     Head: Normocephalic and atraumatic.     Nose: Nose normal.     Mouth/Throat:     Mouth: Mucous membranes are moist.  Eyes:     Extraocular Movements: Extraocular movements intact.     Conjunctiva/sclera: Conjunctivae normal.  Cardiovascular:     Rate and Rhythm: Normal rate.  Pulmonary:     Effort: Pulmonary effort is normal.     Breath sounds: Normal breath sounds.  Abdominal:     General: Abdomen is flat.     Palpations: Abdomen is soft.  Tenderness: There is no abdominal tenderness.  Musculoskeletal:        General: No swelling. Normal range of motion.     Cervical back: Neck supple.     Comments: Normal pulses, compartments in thigh and lower leg are soft. Normal distal pulses.   Skin:    General: Skin is warm and dry.  Neurological:     General: No focal deficit present.     Mental Status: He is alert.     Comments: Patient able to plantar/dosiflex foot and wriggle toes. Subjective decreased sensation of foot and lower leg   Psychiatric:        Mood and Affect: Mood normal.      ED Results / Procedures / Treatments   Labs (all labs ordered are listed, but only abnormal results are displayed) Labs Reviewed - No data to display  EKG None   Radiology No results  found.  Procedures Procedures  Medications Ordered in the ED Medications  gabapentin (NEURONTIN) capsule 100 mg (has no administration in time range)     MDM Rules/Calculators/A&P MDM Patient with symptoms likely due to neuropathy from subacute compartment syndrome several weeks ago. He states he has not followed up because Ortho clinic wanted him to pay up front. He has been wearing his cam walker, but the strap for the boot have come off and he needs a new one. Discussed possible permanent nerve damage and that time will tell if he regains any additional sensation. In the meantime, will Rx Neurontin for symptoms. Advised that this may make him sleepy and not to mix with drugs or alcohol. Advised to followup with Ortho.  ED Course  I have reviewed the triage vital signs and the nursing notes.  Pertinent labs & imaging results that were available during my care of the patient were reviewed by me and considered in my medical decision making (see chart for details).     Final Clinical Impression(s) / ED Diagnoses Final diagnoses:  Neuropathy of left foot    Rx / DC Orders ED Discharge Orders         Ordered    gabapentin (NEURONTIN) 100 MG capsule  3 times daily        09/27/19 1529           Pollyann Savoy, MD 09/27/19 1529

## 2019-09-27 NOTE — ED Triage Notes (Signed)
Sharp pain in his left foot x 3 weeks. He is wearing a cam walker from a visit for same 3 weeks ago.

## 2019-09-27 NOTE — ED Notes (Signed)
In addition to distal LLE and medial foot paain, pt reports mild numbness to lateral left foot

## 2019-10-19 ENCOUNTER — Inpatient Hospital Stay (HOSPITAL_BASED_OUTPATIENT_CLINIC_OR_DEPARTMENT_OTHER)
Admission: EM | Admit: 2019-10-19 | Discharge: 2019-10-29 | DRG: 175 | Disposition: A | Discharge status: Home / Self Care | Payer: Self-pay | Attending: Internal Medicine | Admitting: Internal Medicine

## 2019-10-19 ENCOUNTER — Emergency Department (HOSPITAL_BASED_OUTPATIENT_CLINIC_OR_DEPARTMENT_OTHER): Payer: Self-pay

## 2019-10-19 ENCOUNTER — Encounter (HOSPITAL_BASED_OUTPATIENT_CLINIC_OR_DEPARTMENT_OTHER): Payer: Self-pay | Admitting: *Deleted

## 2019-10-19 ENCOUNTER — Other Ambulatory Visit: Payer: Self-pay

## 2019-10-19 DIAGNOSIS — J189 Pneumonia, unspecified organism: Secondary | ICD-10-CM

## 2019-10-19 DIAGNOSIS — I2699 Other pulmonary embolism without acute cor pulmonale: Secondary | ICD-10-CM

## 2019-10-19 DIAGNOSIS — M549 Dorsalgia, unspecified: Secondary | ICD-10-CM | POA: Diagnosis present

## 2019-10-19 DIAGNOSIS — Z888 Allergy status to other drugs, medicaments and biological substances status: Secondary | ICD-10-CM

## 2019-10-19 DIAGNOSIS — Z20822 Contact with and (suspected) exposure to covid-19: Secondary | ICD-10-CM | POA: Diagnosis present

## 2019-10-19 DIAGNOSIS — Z79899 Other long term (current) drug therapy: Secondary | ICD-10-CM

## 2019-10-19 DIAGNOSIS — Z8739 Personal history of other diseases of the musculoskeletal system and connective tissue: Secondary | ICD-10-CM

## 2019-10-19 DIAGNOSIS — J9601 Acute respiratory failure with hypoxia: Secondary | ICD-10-CM | POA: Diagnosis present

## 2019-10-19 DIAGNOSIS — M792 Neuralgia and neuritis, unspecified: Secondary | ICD-10-CM | POA: Diagnosis present

## 2019-10-19 DIAGNOSIS — F1910 Other psychoactive substance abuse, uncomplicated: Secondary | ICD-10-CM | POA: Diagnosis present

## 2019-10-19 DIAGNOSIS — G8929 Other chronic pain: Secondary | ICD-10-CM | POA: Diagnosis present

## 2019-10-19 DIAGNOSIS — Z880 Allergy status to penicillin: Secondary | ICD-10-CM

## 2019-10-19 DIAGNOSIS — F17210 Nicotine dependence, cigarettes, uncomplicated: Secondary | ICD-10-CM | POA: Diagnosis present

## 2019-10-19 DIAGNOSIS — Z9119 Patient's noncompliance with other medical treatment and regimen: Secondary | ICD-10-CM

## 2019-10-19 DIAGNOSIS — F209 Schizophrenia, unspecified: Secondary | ICD-10-CM | POA: Diagnosis present

## 2019-10-19 HISTORY — DX: Pneumonia, unspecified organism: J18.9

## 2019-10-19 HISTORY — DX: Other pulmonary embolism without acute cor pulmonale: I26.99

## 2019-10-19 HISTORY — DX: Personal history of other diseases of the musculoskeletal system and connective tissue: Z87.39

## 2019-10-19 LAB — CBC WITH DIFFERENTIAL/PLATELET
Abs Immature Granulocytes: 0.06 10*3/uL (ref 0.00–0.07)
Basophils Absolute: 0 10*3/uL (ref 0.0–0.1)
Basophils Relative: 0 %
Eosinophils Absolute: 0.1 10*3/uL (ref 0.0–0.5)
Eosinophils Relative: 1 %
HCT: 42 % (ref 39.0–52.0)
Hemoglobin: 13.5 g/dL (ref 13.0–17.0)
Immature Granulocytes: 1 %
Lymphocytes Relative: 17 %
Lymphs Abs: 1.8 10*3/uL (ref 0.7–4.0)
MCH: 27.2 pg (ref 26.0–34.0)
MCHC: 32.1 g/dL (ref 30.0–36.0)
MCV: 84.5 fL (ref 80.0–100.0)
Monocytes Absolute: 0.8 10*3/uL (ref 0.1–1.0)
Monocytes Relative: 7 %
Neutro Abs: 8.1 10*3/uL — ABNORMAL HIGH (ref 1.7–7.7)
Neutrophils Relative %: 74 %
Platelets: 252 10*3/uL (ref 150–400)
RBC: 4.97 MIL/uL (ref 4.22–5.81)
RDW: 12.4 % (ref 11.5–15.5)
WBC: 10.9 10*3/uL — ABNORMAL HIGH (ref 4.0–10.5)
nRBC: 0 % (ref 0.0–0.2)

## 2019-10-19 LAB — BASIC METABOLIC PANEL
Anion gap: 13 (ref 5–15)
BUN: 7 mg/dL (ref 6–20)
CO2: 25 mmol/L (ref 22–32)
Calcium: 8.9 mg/dL (ref 8.9–10.3)
Chloride: 99 mmol/L (ref 98–111)
Creatinine, Ser: 0.73 mg/dL (ref 0.61–1.24)
GFR, Estimated: >60 mL/min (ref >60)
Glucose, Bld: 98 mg/dL (ref 70–99)
Potassium: 3.6 mmol/L (ref 3.5–5.1)
Sodium: 137 mmol/L (ref 135–145)

## 2019-10-19 LAB — C-REACTIVE PROTEIN: CRP: 9.8 mg/dL — ABNORMAL HIGH (ref <1.0)

## 2019-10-19 LAB — D-DIMER, QUANTITATIVE: D-Dimer, Quant: 2.99 ug/mL-FEU — ABNORMAL HIGH (ref 0.00–0.50)

## 2019-10-19 LAB — LACTIC ACID, PLASMA: Lactic Acid, Venous: 1.8 mmol/L (ref 0.5–1.9)

## 2019-10-19 LAB — PROTIME-INR
INR: 1 (ref 0.8–1.2)
Prothrombin Time: 12.7 seconds (ref 11.4–15.2)

## 2019-10-19 LAB — RESPIRATORY PANEL BY RT PCR (FLU A&B, COVID)
Influenza A by PCR: NEGATIVE
Influenza B by PCR: NEGATIVE
SARS Coronavirus 2 by RT PCR: NEGATIVE

## 2019-10-19 LAB — APTT: aPTT: 26 seconds (ref 24–36)

## 2019-10-19 LAB — TROPONIN I (HIGH SENSITIVITY)
Troponin I (High Sensitivity): 75 ng/L — ABNORMAL HIGH (ref <18)
Troponin I (High Sensitivity): 63 ng/L — ABNORMAL HIGH (ref <18)

## 2019-10-19 MED ORDER — ADULT MULTIVITAMIN W/MINERALS CH
1.0000 | ORAL_TABLET | Freq: Every day | ORAL | Status: DC
  Administered 2019-10-20 – 2019-10-29 (×10): 1 via ORAL
  Filled 2019-10-19 (×10): qty 1

## 2019-10-19 MED ORDER — SODIUM CHLORIDE 0.9% FLUSH
3.0000 mL | Freq: Two times a day (BID) | INTRAVENOUS | Status: DC
  Administered 2019-10-20 – 2019-10-28 (×16): 3 mL via INTRAVENOUS

## 2019-10-19 MED ORDER — HEPARIN (PORCINE) 25000 UT/250ML-% IV SOLN
2200.0000 [IU]/h | INTRAVENOUS | Status: DC
  Administered 2019-10-19: 1800 [IU]/h via INTRAVENOUS
  Administered 2019-10-20: 2100 [IU]/h via INTRAVENOUS
  Administered 2019-10-20: 1800 [IU]/h via INTRAVENOUS
  Administered 2019-10-21 – 2019-10-25 (×9): 2200 [IU]/h via INTRAVENOUS
  Administered 2019-10-26: 2300 [IU]/h via INTRAVENOUS
  Administered 2019-10-26: 2200 [IU]/h via INTRAVENOUS
  Administered 2019-10-27 – 2019-10-29 (×5): 2300 [IU]/h via INTRAVENOUS
  Filled 2019-10-19 (×19): qty 250

## 2019-10-19 MED ORDER — SODIUM CHLORIDE 0.9 % IV SOLN: 2.0000 g | INTRAVENOUS | Status: DC

## 2019-10-19 MED ORDER — SODIUM CHLORIDE 0.9 % IV SOLN: 500.0000 mg | INTRAVENOUS | Status: DC

## 2019-10-19 MED ORDER — ACETAMINOPHEN 325 MG PO TABS
650.0000 mg | ORAL_TABLET | Freq: Four times a day (QID) | ORAL | Status: DC | PRN
  Administered 2019-10-20 – 2019-10-24 (×7): 650 mg via ORAL
  Filled 2019-10-19 (×10): qty 2

## 2019-10-19 MED ORDER — GABAPENTIN 100 MG PO CAPS
100.0000 mg | ORAL_CAPSULE | Freq: Three times a day (TID) | ORAL | Status: DC
  Administered 2019-10-19 – 2019-10-29 (×29): 100 mg via ORAL
  Filled 2019-10-19 (×29): qty 1

## 2019-10-19 MED ORDER — IOHEXOL 350 MG/ML SOLN
100.0000 mL | Freq: Once | INTRAVENOUS | Status: AC | PRN
  Administered 2019-10-19: 100 mL via INTRAVENOUS

## 2019-10-19 MED ORDER — LORAZEPAM 2 MG/ML IJ SOLN
1.0000 mg | Freq: Once | INTRAMUSCULAR | Status: AC
  Administered 2019-10-19: 1 mg via INTRAVENOUS
  Filled 2019-10-19: qty 1

## 2019-10-19 MED ORDER — THIAMINE HCL 100 MG PO TABS
100.0000 mg | ORAL_TABLET | Freq: Every day | ORAL | Status: DC
  Administered 2019-10-20 – 2019-10-29 (×10): 100 mg via ORAL
  Filled 2019-10-19 (×10): qty 1

## 2019-10-19 MED ORDER — SODIUM CHLORIDE 0.9 % IV SOLN
500.0000 mg | INTRAVENOUS | Status: AC
  Administered 2019-10-20 – 2019-10-24 (×5): 500 mg via INTRAVENOUS
  Filled 2019-10-19 (×5): qty 500

## 2019-10-19 MED ORDER — SODIUM CHLORIDE 0.9 % IV SOLN
1.0000 g | Freq: Once | INTRAVENOUS | Status: AC
  Administered 2019-10-19: 1 g via INTRAVENOUS
  Filled 2019-10-19: qty 10

## 2019-10-19 MED ORDER — NICOTINE 21 MG/24HR TD PT24
21.0000 mg | MEDICATED_PATCH | Freq: Every day | TRANSDERMAL | Status: DC
  Administered 2019-10-19 – 2019-10-26 (×8): 21 mg via TRANSDERMAL
  Filled 2019-10-19 (×9): qty 1

## 2019-10-19 MED ORDER — HEPARIN BOLUS VIA INFUSION
5000.0000 [IU] | Freq: Once | INTRAVENOUS | Status: AC
  Administered 2019-10-19: 5000 [IU] via INTRAVENOUS

## 2019-10-19 MED ORDER — FOLIC ACID 1 MG PO TABS
1.0000 mg | ORAL_TABLET | Freq: Every day | ORAL | Status: DC
  Administered 2019-10-20 – 2019-10-29 (×10): 1 mg via ORAL
  Filled 2019-10-19 (×10): qty 1

## 2019-10-19 MED ORDER — SODIUM CHLORIDE 0.9 % IV SOLN: INTRAVENOUS | Status: DC | PRN

## 2019-10-19 MED ORDER — MIRTAZAPINE 15 MG PO TBDP
15.0000 mg | ORAL_TABLET | Freq: Every day | ORAL | Status: DC
  Administered 2019-10-19 – 2019-10-28 (×10): 15 mg via ORAL
  Filled 2019-10-19 (×11): qty 1

## 2019-10-19 MED ORDER — ACETAMINOPHEN 650 MG RE SUPP: 650.0000 mg | Freq: Four times a day (QID) | RECTAL | Status: DC | PRN

## 2019-10-19 MED ORDER — SODIUM CHLORIDE 0.9 % IV SOLN
500.0000 mg | Freq: Once | INTRAVENOUS | Status: AC
  Administered 2019-10-19: 500 mg via INTRAVENOUS
  Filled 2019-10-19: qty 500

## 2019-10-19 MED ORDER — SODIUM CHLORIDE 0.9 % IV SOLN
2.0000 g | INTRAVENOUS | Status: AC
  Administered 2019-10-20 – 2019-10-24 (×5): 2 g via INTRAVENOUS
  Filled 2019-10-19 (×4): qty 20
  Filled 2019-10-19: qty 2

## 2019-10-19 NOTE — ED Notes (Signed)
Pt ambulated to room 3 on pulse ox. HR 108-115; O2 sats 91-93% on room air. Moderate dyspnea noted

## 2019-10-19 NOTE — ED Notes (Signed)
Having episodes of increased tachypnea and POX decreasing below 88%, pt placed on 2lpm via [place]. ED PA at bedside

## 2019-10-19 NOTE — ED Notes (Signed)
ED PA INFORMED OF TROPONIN LEVELS

## 2019-10-19 NOTE — Progress Notes (Signed)
ANTICOAGULATION CONSULT NOTE - Initial Consult  Pharmacy Consult for heparin Indication: pulmonary embolus  Allergies  Allergen Reactions  . Haloperidol Lactate Other (See Comments)  . Penicillins Hives    Patient Measurements: Height: 6' (182.9 cm) Weight: 112.5 kg (248 lb) IBW/kg (Calculated) : 77.6 Heparin Dosing Weight: 101 kg  Vital Signs: Temp: 98.6 F (37 C) (10/08 1302) Temp Source: Oral (10/08 1302) BP: 117/89 (10/08 1624) Pulse Rate: 86 (10/08 1624)  Labs: Recent Labs    10/19/19 1419  HGB 13.5  HCT 42.0  PLT 252  CREATININE 0.73  TROPONINIHS 75*    Estimated Creatinine Clearance: 176.5 mL/min (by C-G formula based on SCr of 0.73 mg/dL).   Medical History: Past Medical History:  Diagnosis Date  . Chronic back pain   . Depression     Medications:  Scheduled:  . heparin  5,000 Units Intravenous Once    Assessment: Patient with bilateral pulmonary emboli  Goal of Therapy:  Heparin level 0.3-0.7 units/ml Monitor platelets by anticoagulation protocol: Yes   Plan:  Give 5000 units bolus x 1 Start heparin infusion at 1800 units/hr Check anti-Xa level in 6 hours and daily while on heparin Continue to monitor H&H and platelets  Sherilyn Cooter A Jaymason Ledesma 10/19/2019,4:55 PM

## 2019-10-19 NOTE — H&P (Addendum)
History and Physical   Andrew [name] BDZ:[id number] DOB: 1990/09/14 DOA: 10/19/2019  PCP: Patient, No Pcp Per   Patient coming from: Home via [facility]  Chief Complaint: Dyspnea and productive cough  HPI: Andrew [name] is a 29 y.o. male with medical history significant of depression, schizophrenia, back pain, polysubstance use (heroin, marijuana, tobacco, alcohol), and hospitalization from August 10 to August 15 for rhabdo my lysis and compartment syndrome (did not require surgical intervention) who presented with 3 days of productive cough and worsening dyspnea. As above, symptoms were present for about 3 days. He states he has had associated intermittent trace bloody sputum if he has a deep cough and he experiences pleuritic chest pain whenever he inhales deeply. He also endorses a decreased appetite. He denies nausea, vomiting, fever, abdominal pain, constipation, diarrhea.  ED Course: Vital signs stable lab work-up significant for WBC 10.9, D-dimer 2.99, troponin 75 which trended flat to 63, lactic acid was normal, and CRP is pending. Blood cultures also obtained. EKG showed sinus rhythm with borderline QTC of 46. Chest x-ray showed multifocal pneumonia. CTA PE study showed bilateral PE involving segmental branches of the lower lobes with slight right ventricular enlargement which may represent a degree of strain; CTA also redemonstrated multifocal pneumonia possibly atypical in etiology.   Review of Systems: As per HPI otherwise all other systems reviewed and are negative.  Past Medical History:  Diagnosis Date  . Chronic back pain   . Depression     History reviewed. No pertinent surgical history.  Social History  reports that he has been smoking cigarettes. He has been smoking about 1.00 pack per day. He has never used smokeless tobacco. He reports current alcohol use. He reports current drug use. Drugs: Marijuana and Cocaine.  Allergies  Allergen  Reactions  . Haloperidol Lactate Other (See Comments)  . Penicillins Hives    Family History  Family history unknown: Yes   Prior to Admission medications   Medication Sig Start Date End Date Taking? Authorizing Provider  folic acid (FOLVITE) 1 MG tablet Take 1 tablet (1 mg total) by mouth daily. 08/27/19   Kathlen Mody, MD  gabapentin (NEURONTIN) 100 MG capsule Take 1 capsule (100 mg total) by mouth 3 (three) times daily. 09/27/19   Pollyann Savoy, MD  mirtazapine (REMERON SOL-TAB) 15 MG disintegrating tablet Take 15 mg by mouth at bedtime as needed.    [provider]  Multiple Vitamin (MULTIVITAMIN WITH MINERALS) TABS tablet Take 1 tablet by mouth daily. 08/27/19   Kathlen Mody, MD  oxyCODONE (ROXICODONE) 5 MG immediate release tablet Take 1 tablet (5 mg total) by mouth every 4 (four) hours as needed for severe pain. 08/24/19   Armida Sans, PA-C  thiamine 100 MG tablet Take 1 tablet (100 mg total) by mouth daily. 08/27/19   Kathlen Mody, MD    Physical Exam: Vitals:   10/19/19 1715 10/19/19 1800 10/19/19 1816 10/19/19 2053  BP: 117/81 111/72 122/75 130/76  Pulse: 78 89  85  Resp: (!) 40 (!) 30 (!) 24 20  Temp: 98.8 F (37.1 C)   98.7 F (37.1 C)  TempSrc: Oral   Oral  SpO2: 99% 100% 98% 100%  Weight:    114.2 kg  Height:    6' (1.829 m)    Constitutional: NAD, calm, comfortable Vitals:   10/19/19 1715 10/19/19 1800 10/19/19 1816 10/19/19 2053  BP: 117/81 111/72 122/75 130/76  Pulse: 78 89  85  Resp: (!)  40 (!) 30 (!) 24 20  Temp: 98.8 F (37.1 C)   98.7 F (37.1 C)  TempSrc: Oral   Oral  SpO2: 99% 100% 98% 100%  Weight:    114.2 kg  Height:    6' (1.829 m)   Physical Exam Constitutional:      General: He is not in acute distress.    Appearance: Normal appearance.  HENT:     Head: Normocephalic and atraumatic.     Mouth/Throat:     Mouth: Mucous membranes are moist.     Pharynx: Oropharynx is clear.  Eyes:     Extraocular Movements:  Extraocular movements intact.     Pupils: Pupils are equal, round, and reactive to light.  Cardiovascular:     Rate and Rhythm: Normal rate and regular rhythm.     Pulses: Normal pulses.     Heart sounds: Normal heart sounds.  Pulmonary:     Effort: Pulmonary effort is normal. No respiratory distress.     Breath sounds: Normal breath sounds.  Abdominal:     General: Bowel sounds are normal. There is no distension.     Palpations: Abdomen is soft.     Tenderness: There is no abdominal tenderness.  Musculoskeletal:        General: No swelling or deformity.  Skin:    General: Skin is warm and dry.  Neurological:     General: No focal deficit present.     Mental Status: Mental status is at baseline.    Labs on Admission: I have personally reviewed following labs and imaging studies  CBC: Recent Labs  Lab 10/19/19 1419  WBC 10.9*  NEUTROABS 8.1*  HGB 13.5  HCT 42.0  MCV 84.5  PLT 252    Basic Metabolic Panel: Recent Labs  Lab 10/19/19 1419  NA 137  K 3.6  CL 99  CO2 25  GLUCOSE 98  BUN 7  CREATININE 0.73  CALCIUM 8.9    GFR: Estimated Creatinine Clearance: 177.7 mL/min (by C-G formula based on SCr of 0.73 mg/dL).  Liver Function Tests: No results for input(s): AST, ALT, ALKPHOS, BILITOT, PROT, ALBUMIN in the last 168 hours.  Urine analysis:    Component Value Date/Time   COLORURINE BROWN (A) 08/21/2019 1504   APPEARANCEUR HAZY (A) 08/21/2019 1504   LABSPEC 1.020 08/21/2019 1504   PHURINE 6.0 08/21/2019 1504   GLUCOSEU (A) 08/21/2019 1504    TEST NOT REPORTED DUE TO COLOR INTERFERENCE OF URINE PIGMENT   HGBUR (A) 08/21/2019 1504    TEST NOT REPORTED DUE TO COLOR INTERFERENCE OF URINE PIGMENT   BILIRUBINUR (A) 08/21/2019 1504    TEST NOT REPORTED DUE TO COLOR INTERFERENCE OF URINE PIGMENT   KETONESUR (A) 08/21/2019 1504    TEST NOT REPORTED DUE TO COLOR INTERFERENCE OF URINE PIGMENT   PROTEINUR (A) 08/21/2019 1504    TEST NOT REPORTED DUE TO COLOR  INTERFERENCE OF URINE PIGMENT   UROBILINOGEN 0.2 05/13/2014 0642   NITRITE (A) 08/21/2019 1504    TEST NOT REPORTED DUE TO COLOR INTERFERENCE OF URINE PIGMENT   LEUKOCYTESUR (A) 08/21/2019 1504    TEST NOT REPORTED DUE TO COLOR INTERFERENCE OF URINE PIGMENT    Radiological Exams on Admission: CT Angio Chest PE W and/or Wo Contrast  Result Date: 10/19/2019 CLINICAL DATA:  29 year old male with concern for pulmonary embolism. EXAM: CT ANGIOGRAPHY CHEST WITH CONTRAST TECHNIQUE: Multidetector CT imaging of the chest was performed using the standard protocol during bolus administration of intravenous  contrast. Multiplanar CT image reconstructions and MIPs were obtained to evaluate the vascular anatomy. CONTRAST:  OMNIPAQUE IOHEXOL 350 MG/ML SOLN COMPARISON:  Chest radiograph dated 10/19/2019. FINDINGS: Cardiovascular: There is no cardiomegaly or pericardial effusion. There is mild dilatation of the right ventricle. The right ventricular lumen measures approximately 4.9 cm in diameter and the left ventricular lumen measures 4.1 cm with a RV/LV ratio of 1.2. The thoracic aorta is unremarkable. The origins of the great vessels of the aortic arch appear patent. Mild dilatation of main pulmonary trunk may be related to a degree of hypertension. There are bilateral pulmonary artery emboli primarily involving the segmental branches of the lower lobes. Mediastinum/Nodes: Right hilar adenopathy measuring 14 mm in short axis. Mildly enlarged subcarinal lymph nodes measure up to 15 mm. The esophagus and the thyroid gland are grossly unremarkable. No mediastinal fluid collection. Lungs/Pleura: Bilateral nodular and ground-glass opacities most consistent with multifocal pneumonia, likely atypical in etiology and may represent fungal infection, or other atypical agent. Clinical correlation is recommended. There is a small cavitary nodule in the left upper lobe (134/6). Septic emboli are not excluded. Upper Abdomen: No  acute abnormality. Musculoskeletal: No chest wall abnormality. No acute or significant osseous findings. Review of the MIP images confirms the above findings. IMPRESSION: 1. Bilateral pulmonary artery emboli primarily involving segmental branches of the lower lobes. There is slight enlargement the right ventricle which may represent a degree of straining. Clinical correlation is recommended. 2. Multifocal pneumonia, possibly fungal or atypical in etiology. These results were called by telephone at the time of interpretation on 10/19/2019 at 4:27 pm to Dr Tilden Fossa, who verbally acknowledged these results. Electronically Signed   By: Elgie Collard M.D.   On: 10/19/2019 16:36   DG Chest Portable 1 View  Result Date: 10/19/2019 CLINICAL DATA:  Cough and shortness of breath. EXAM: PORTABLE CHEST 1 VIEW COMPARISON:  Chest x-ray dated February 04, 2010. FINDINGS: The heart size and mediastinal contours are within normal limits. Normal pulmonary vascularity. Patchy consolidation in the right greater than left upper lobes and right lung base. No pleural effusion or pneumothorax. No acute osseous abnormality. IMPRESSION: 1. Multifocal pneumonia. Electronically Signed   By: Obie Dredge M.D.   On: 10/19/2019 13:55    EKG: Independently reviewed. Sinus rhythm with borderline QTC  Assessment/Plan Active Problems:   Bilateral pulmonary embolism (HCC)  Bilateral pulmonary embolism: Demonstrated on CTA. He was hospitalized in August for rhabdo and compartment syndrome of the left lower extremity (which did not require surgical intervention). CTA did show possible mild right heart strain, but patient remains hemodynamically stable. - Admitted to progressive - Continue heparin drip - Lower extremity Dopplers bilaterally - Echocardiogram  Multifocal pneumonia: Noted on x-ray and CTA. Patient denies fevers. White count mildly elevated to 10.9 (though this could be partially reactive due to PE). Received  ceftriaxone and azithromycin in ED. Blood cultures in ED. He has not been vaccinated for COVID-19, but tested negative in the ED. Possibly atypical.  He does report some streaks of blood with deep cough though this may be related to PE as above. - Continue ceftriaxone and azithromycin - MRSA swab - Legionella and strep urinary antigen - Sputum culture - Follow-up blood cultures - Trend fever curve and white count - Follow-up CRP  Polysubstance use: Reports snorting heroin including 3 days ago, but not injecting; also reports marijuana, alcohol, and tobacco use. - CIWA - Nicotine patch  History of schizophrenia - Continue mirtazapine  History  of rhabdomyolysis with residual nerve pain - Continue gabapentin  DVT prophylaxis: Heparin Code Status:   Full Family Communication:  Not performed on admission Disposition Plan:   Patient is from:  Home  Anticipated DC to:  Home  Anticipated DC date:  10/11  Anticipated DC barriers: Uninsured  Consults called:  None Admission status:  Inpatient progressive  Severity of Illness: The appropriate patient status for this patient is INPATIENT. Inpatient status is judged to be reasonable and necessary in order to provide the required intensity of service to ensure the patient's safety. The patient's presenting symptoms, physical exam findings, and initial radiographic and laboratory data in the context of their chronic comorbidities is felt to place them at high risk for further clinical deterioration. Furthermore, it is not anticipated that the patient will be medically stable for discharge from the hospital within 2 midnights of admission. The following factors support the patient status of inpatient.   " The patient's presenting symptoms include dyspnea, pleuritic chest pain " The initial radiographic and laboratory data are worrisome because of pulmonary embolism and multifocal pneumonia. " The chronic co-morbidities include  schizophrenia.   * I certify that at the point of admission it is my clinical judgment that the patient will require inpatient hospital care spanning beyond 2 midnights from the point of admission due to high intensity of service, high risk for further deterioration and high frequency of surveillance required.Synetta Fail MD [facility]  How to contact the [facility] Attending or Consulting provider 7A - 7P or covering provider during after hours 7P -7A, for this patient?   1. Check the care team in [facility] and look for a) attending/consulting [facility] provider listed and b) the [facility] team listed 2. Log into [email or web address] and use [place]'s universal password to access. If you do not have the password, please contact the hospital operator. 3. Locate the [facility] provider you are looking for under [facility] and page to a number that you can be directly reached. 4. If you still have difficulty reaching the provider, please page the [facility] (Director on Call) for the Hospitalists listed on amion for assistance.  10/19/2019, 9:32 PM

## 2019-10-19 NOTE — ED Notes (Signed)
ED PA informed of D Dimer results

## 2019-10-19 NOTE — ED Triage Notes (Signed)
Pt reports cough, SOB since Tuesday. Reports coughing up small amount bloody sputum

## 2019-10-19 NOTE — ED Provider Notes (Signed)
[facility] EMERGENCY DEPARTMENT Provider Note   CSN: [id number] Arrival date & time: 10/19/19  1256     History Chief Complaint  Patient presents with  . Shortness of Breath    Andrew [name] is a 29 y.o. male.  HPI   Patient is a 29 year old male with a medical history as noted below.  He states that about 3 days ago he began experiencing productive cough with shortness of breath.  He notes some intermittent traces of blood in his sputum which he feels has been improving over the past few days.  Reports associated central chest tightness when taking deep breaths.  He denies being vaccinated for COVID-19.  He reports associated decreased appetite without vomiting.  States that he snorted heroin a few days ago prior to the onset of his symptoms.  Denies any history of IVDA.  Denies fevers, chills, urinary changes, syncope.    Past Medical History:  Diagnosis Date  . Chronic back pain   . Depression     Patient Active Problem List   Diagnosis Date Noted  . Rhabdomyolysis 08/21/2019  . Left leg weakness 08/21/2019  . Cold left foot 08/21/2019  . Polysubstance abuse (HCC) 08/21/2019    History reviewed. No pertinent surgical history.     Family History  Family history unknown: Yes    Social History   Tobacco Use  . Smoking status: Current Every Day Smoker    Packs/day: 1.00    Types: Cigarettes  . Smokeless tobacco: Never Used  Vaping Use  . Vaping Use: Never used  Substance Use Topics  . Alcohol use: Yes    Comment: occassional  . Drug use: Yes    Types: Marijuana, Cocaine    Comment: occassional    Home Medications Prior to Admission medications   Medication Sig Start Date End Date Taking? Authorizing Provider  folic acid (FOLVITE) 1 MG tablet Take 1 tablet (1 mg total) by mouth daily. 08/27/19   Kathlen Mody, MD  gabapentin (NEURONTIN) 100 MG capsule Take 1 capsule (100 mg total) by mouth 3 (three) times daily. 09/27/19   Pollyann Savoy, MD  mirtazapine (REMERON SOL-TAB) 15 MG disintegrating tablet Take 15 mg by mouth at bedtime as needed.    [provider]  Multiple Vitamin (MULTIVITAMIN WITH MINERALS) TABS tablet Take 1 tablet by mouth daily. 08/27/19   Kathlen Mody, MD  oxyCODONE (ROXICODONE) 5 MG immediate release tablet Take 1 tablet (5 mg total) by mouth every 4 (four) hours as needed for severe pain. 08/24/19   Armida Sans, PA-C  thiamine 100 MG tablet Take 1 tablet (100 mg total) by mouth daily. 08/27/19   Kathlen Mody, MD    Allergies    Haloperidol lactate and Penicillins  Review of Systems   Review of Systems  All other systems reviewed and are negative. Ten systems reviewed and are negative for acute change, except as noted in the HPI.     Physical Exam Updated Vital Signs BP 121/85 (BP Location: Right Arm)   Pulse 95   Temp 98.6 F (37 C) (Oral)   Resp (!) 26   Ht 6' (1.829 m)   Wt 112.5 kg   SpO2 96%   BMI 33.63 kg/m   Physical Exam Vitals and nursing note reviewed.  Constitutional:      General: He is not in acute distress.    Appearance: Normal appearance. He is well-developed and normal weight. He is not ill-appearing, toxic-appearing or diaphoretic.  HENT:     Head: Normocephalic and atraumatic.     Right Ear: External ear normal.     Left Ear: External ear normal.     Nose: Nose normal.     Mouth/Throat:     Mouth: Mucous membranes are moist.     Pharynx: Oropharynx is clear. No oropharyngeal exudate or posterior oropharyngeal erythema.  Eyes:     Extraocular Movements: Extraocular movements intact.  Cardiovascular:     Rate and Rhythm: Normal rate and regular rhythm.     Pulses: Normal pulses.     Heart sounds: Normal heart sounds. No murmur heard.  No friction rub. No gallop.   Pulmonary:     Effort: Pulmonary effort is normal. No respiratory distress.     Breath sounds: Normal breath sounds. No stridor. No decreased breath sounds, wheezing, rhonchi or  rales.     Comments: Lungs are clear to auscultation bilaterally.  Oxygen saturations between 92 to 96% on room air while conversing with me. Abdominal:     General: Abdomen is flat.     Palpations: Abdomen is soft.     Tenderness: There is no abdominal tenderness.  Musculoskeletal:        General: Normal range of motion.     Cervical back: Normal range of motion and neck supple. No tenderness.  Skin:    General: Skin is warm and dry.  Neurological:     General: No focal deficit present.     Mental Status: He is alert and oriented to person, place, and time.  Psychiatric:        Mood and Affect: Mood normal.        Behavior: Behavior normal.    ED Results / Procedures / Treatments   Labs (all labs ordered are listed, but only abnormal results are displayed) Labs Reviewed  CBC WITH DIFFERENTIAL/PLATELET - Abnormal; Notable for the following components:      Result Value   WBC 10.9 (*)    Neutro Abs 8.1 (*)    All other components within normal limits  D-DIMER, QUANTITATIVE (NOT AT [facility]) - Abnormal; Notable for the following components:   D-Dimer, Quant 2.99 (*)    All other components within normal limits  TROPONIN I (HIGH SENSITIVITY) - Abnormal; Notable for the following components:   Troponin I (High Sensitivity) 75 (*)    All other components within normal limits  TROPONIN I (HIGH SENSITIVITY) - Abnormal; Notable for the following components:   Troponin I (High Sensitivity) 63 (*)    All other components within normal limits  RESPIRATORY PANEL BY RT PCR (FLU A&B, COVID)  CULTURE, BLOOD (ROUTINE X 2)  CULTURE, BLOOD (ROUTINE X 2)  BASIC METABOLIC PANEL  LACTIC ACID, PLASMA  PROTIME-INR  APTT  C-REACTIVE PROTEIN  HIV ANTIBODY (ROUTINE TESTING W REFLEX)  HEPARIN LEVEL (UNFRACTIONATED)   EKG EKG Interpretation  Date/Time:  Friday October 19 2019 13:24:11 EDT Ventricular Rate:  89 PR Interval:    QRS Duration: 93 QT Interval:  399 QTC Calculation: 486 R  Axis:   62 Text Interpretation: Sinus rhythm Borderline prolonged QT interval When compared to prior, similar appearnce. No STEMI Confirmed by Theda Belfast ([id number]) on 10/19/2019 1:30:58 PM  Radiology CT Angio Chest PE W and/or Wo Contrast  Result Date: 10/19/2019 CLINICAL DATA:  29 year old male with concern for pulmonary embolism. EXAM: CT ANGIOGRAPHY CHEST WITH CONTRAST TECHNIQUE: Multidetector CT imaging of the chest was performed using the standard protocol during bolus administration of intravenous  contrast. Multiplanar CT image reconstructions and MIPs were obtained to evaluate the vascular anatomy. CONTRAST:  OMNIPAQUE IOHEXOL 350 MG/ML SOLN COMPARISON:  Chest radiograph dated 10/19/2019. FINDINGS: Cardiovascular: There is no cardiomegaly or pericardial effusion. There is mild dilatation of the right ventricle. The right ventricular lumen measures approximately 4.9 cm in diameter and the left ventricular lumen measures 4.1 cm with a RV/LV ratio of 1.2. The thoracic aorta is unremarkable. The origins of the great vessels of the aortic arch appear patent. Mild dilatation of main pulmonary trunk may be related to a degree of hypertension. There are bilateral pulmonary artery emboli primarily involving the segmental branches of the lower lobes. Mediastinum/Nodes: Right hilar adenopathy measuring 14 mm in short axis. Mildly enlarged subcarinal lymph nodes measure up to 15 mm. The esophagus and the thyroid gland are grossly unremarkable. No mediastinal fluid collection. Lungs/Pleura: Bilateral nodular and ground-glass opacities most consistent with multifocal pneumonia, likely atypical in etiology and may represent fungal infection, or other atypical agent. Clinical correlation is recommended. There is a small cavitary nodule in the left upper lobe (134/6). Septic emboli are not excluded. Upper Abdomen: No acute abnormality. Musculoskeletal: No chest wall abnormality. No acute or significant osseous  findings. Review of the MIP images confirms the above findings. IMPRESSION: 1. Bilateral pulmonary artery emboli primarily involving segmental branches of the lower lobes. There is slight enlargement the right ventricle which may represent a degree of straining. Clinical correlation is recommended. 2. Multifocal pneumonia, possibly fungal or atypical in etiology. These results were called by telephone at the time of interpretation on 10/19/2019 at 4:27 pm to Dr Tilden Fossa, who verbally acknowledged these results. Electronically Signed   By: Elgie Collard M.D.   On: 10/19/2019 16:36   DG Chest Portable 1 View  Result Date: 10/19/2019 CLINICAL DATA:  Cough and shortness of breath. EXAM: PORTABLE CHEST 1 VIEW COMPARISON:  Chest x-ray dated February 04, 2010. FINDINGS: The heart size and mediastinal contours are within normal limits. Normal pulmonary vascularity. Patchy consolidation in the right greater than left upper lobes and right lung base. No pleural effusion or pneumothorax. No acute osseous abnormality. IMPRESSION: 1. Multifocal pneumonia. Electronically Signed   By: Obie Dredge M.D.   On: 10/19/2019 13:55   Procedures Procedures   Medications Ordered in ED Medications  azithromycin (ZITHROMAX) 500 mg in sodium chloride 0.9 % 250 mL IVPB (500 mg Intravenous New Bag/Given 10/19/19 1735)  heparin ADULT infusion 100 units/mL (25000 units/249mL sodium chloride 0.45%) (1,800 Units/hr Intravenous New Bag/Given 10/19/19 1737)  0.9 %  sodium chloride infusion ( Intravenous New Bag/Given 10/19/19 1713)  iohexol (OMNIPAQUE) 350 MG/ML injection 100 mL (100 mLs Intravenous Contrast Given 10/19/19 1548)  cefTRIAXone (ROCEPHIN) 1 g in sodium chloride 0.9 % 100 mL IVPB (0 g Intravenous Stopped 10/19/19 1731)  heparin bolus via infusion 5,000 Units (5,000 Units Intravenous Bolus from Bag 10/19/19 1739)    ED Course  I have reviewed the triage vital signs and the nursing notes.  Pertinent labs & imaging  results that were available during my care of the patient were reviewed by me and considered in my medical decision making (see chart for details).  Clinical Course as of Oct 18 1756  Fri Oct 19, 2019  1442 WBC(!): 10.9 [LJ]  1442 NEUT#(!): 8.1 [LJ]  1442 D-Dimer, Quant(!): 2.99 [LJ]  1448 Patient noted to desaturate to mid 80s on room air.  He was placed on O2 via nasal cannula.   [LJ]  1526  Elevated from 39, 1 month ago.  Troponin I (High Sensitivity)(!): 75 [LJ]  1526 SARS Coronavirus 2 by RT PCR: NEGATIVE [LJ]  1530 Elevated troponin as well as D-dimer, initial COVID-19 test is negative.  Will obtain a CTA of the chest.   [LJ]  1644 I personally reviewed the patient's CT scan with findings as noted below: 1. Bilateral pulmonary artery emboli primarily involving segmental branches of the lower lobes. There is slight enlargement the right ventricle which may represent a degree of straining. Clinical correlation is recommended. 2. Multifocal pneumonia, possibly fungal or atypical in etiology.  CT Angio Chest PE W and/or Wo Contrast [LJ]  1657 Patient started on IV antibiotics as well as heparin.  Discussed admission with the patient and his mother and he is amenable.   [LJ]    Clinical Course User Index [LJ] Placido SouJoldersma, Ruston Fedora, PA-C   MDM Rules/Calculators/A&P                          Pt is a 29 y.o. male that presents with a history, physical exam, and ED Clinical Course as noted above.   Patient presents today with productive cough and shortness of breath for the past few days.  He endorses heroin usage prior to the onset of his symptoms.  States he snorted heroin but denies any history of IVDA.  I initially obtained basic labs as well as a COVID-19 test on the patient.  His COVID-19 test was negative.  CBC shows leukocytosis of 10.9 with a neutrophilia of 8.1.  Elevated D-dimer at 2.99.  Patient desaturated to the mid 80s on room air and was placed on 2 L via nasal cannula.  I  obtained a CTA of the chest showing bilateral pulmonary artery emboli primarily involving the segmental branches of the lower lobes.  Slight enlargement of the right ventricle which they believe might represent a degree of strain.  Also multifocal pneumonia which they believe could be possibly fungal or atypical in etiology.  Patient states that he has been incarcerated multiple times and most recently was in prison for over 1 year and states that he was released 2 to 3 months ago.  Initial troponin elevated at 75 with a delta of 63.  Patient was discussed with my attending physician.  Patient started on heparin as well as Rocephin and azithromycin.  Obtained blood cultures, HIV testing, CRP.  These are pending.  Discussed admission with the patient and he is amenable.  Patient discussed with the hospitalist team who will accept him for admission.  [facility] will coordinate transfer of the patient at this time.  Note: Portions of this report may have been transcribed using voice recognition software. Every effort was made to ensure accuracy; however, inadvertent computerized transcription errors may be present.   Final Clinical Impression(s) / ED Diagnoses Final diagnoses:  Other acute pulmonary embolism, unspecified whether acute cor pulmonale present [facility](HCC)  Community acquired pneumonia, unspecified laterality   Rx / DC Orders ED Discharge Orders    None       Placido SouJoldersma, Latorria Zeoli, PA-C 10/19/19 1759    Tegeler, Canary Brimhristopher J, MD 10/20/19 [phone]

## 2019-10-19 NOTE — ED Notes (Signed)
COVID SWAB obtained and to the lab 

## 2019-10-19 NOTE — ED Notes (Signed)
NOTE: BLOOD CULTURES OBTAINED PRIOR TO ABX THERAPY

## 2019-10-19 NOTE — ED Notes (Signed)
[facility] AT BEDSIDE 

## 2019-10-19 NOTE — ED Notes (Signed)
This past Tuesday states he began having periods of shortness of breath. Noted to have a strong non-prod cough. States he did rec covid vaccines. Placed on cont cardiac monitoring with cont POX monitoring

## 2019-10-19 NOTE — ED Notes (Signed)
PT REFUSES COVID SWAB

## 2019-10-19 NOTE — ED Notes (Signed)
Pt removed [place] at 2lpm, POX noted to decrease to 85-88%, pt teaching done re: indication for nasal cannula

## 2019-10-20 ENCOUNTER — Inpatient Hospital Stay (HOSPITAL_COMMUNITY): Payer: Self-pay

## 2019-10-20 DIAGNOSIS — I2609 Other pulmonary embolism with acute cor pulmonale: Secondary | ICD-10-CM

## 2019-10-20 DIAGNOSIS — I2699 Other pulmonary embolism without acute cor pulmonale: Secondary | ICD-10-CM

## 2019-10-20 LAB — MRSA PCR SCREENING: MRSA by PCR: NEGATIVE

## 2019-10-20 LAB — HEPARIN LEVEL (UNFRACTIONATED)
Heparin Unfractionated: 0.3 IU/mL (ref 0.30–0.70)
Heparin Unfractionated: <0.1 IU/mL — ABNORMAL LOW (ref 0.30–0.70)
Heparin Unfractionated: 0.45 IU/mL (ref 0.30–0.70)

## 2019-10-20 LAB — COMPREHENSIVE METABOLIC PANEL
ALT: 23 U/L (ref 0–44)
AST: 22 U/L (ref 15–41)
Albumin: 3 g/dL — ABNORMAL LOW (ref 3.5–5.0)
Alkaline Phosphatase: 52 U/L (ref 38–126)
Anion gap: 9 (ref 5–15)
BUN: 7 mg/dL (ref 6–20)
CO2: 27 mmol/L (ref 22–32)
Calcium: 8.8 mg/dL — ABNORMAL LOW (ref 8.9–10.3)
Chloride: 103 mmol/L (ref 98–111)
Creatinine, Ser: 0.87 mg/dL (ref 0.61–1.24)
GFR, Estimated: >60 mL/min (ref >60)
Glucose, Bld: 118 mg/dL — ABNORMAL HIGH (ref 70–99)
Potassium: 3.5 mmol/L (ref 3.5–5.1)
Sodium: 139 mmol/L (ref 135–145)
Total Bilirubin: 0.7 mg/dL (ref 0.3–1.2)
Total Protein: 6.4 g/dL — ABNORMAL LOW (ref 6.5–8.1)

## 2019-10-20 LAB — CBC
HCT: 38.7 % — ABNORMAL LOW (ref 39.0–52.0)
Hemoglobin: 12.5 g/dL — ABNORMAL LOW (ref 13.0–17.0)
MCH: 27.5 pg (ref 26.0–34.0)
MCHC: 32.3 g/dL (ref 30.0–36.0)
MCV: 85.2 fL (ref 80.0–100.0)
Platelets: 287 10*3/uL (ref 150–400)
RBC: 4.54 MIL/uL (ref 4.22–5.81)
RDW: 12.5 % (ref 11.5–15.5)
WBC: 10.9 10*3/uL — ABNORMAL HIGH (ref 4.0–10.5)
nRBC: 0 % (ref 0.0–0.2)

## 2019-10-20 LAB — ECHOCARDIOGRAM COMPLETE
Area-P 1/2: 3.1 cm2
Height: 72 in
S' Lateral: 3.6 cm
Weight: 4028.25 oz

## 2019-10-20 LAB — STREP PNEUMONIAE URINARY ANTIGEN: Strep Pneumo Urinary Antigen: NEGATIVE

## 2019-10-20 LAB — HIV ANTIBODY (ROUTINE TESTING W REFLEX): HIV Screen 4th Generation wRfx: NONREACTIVE

## 2019-10-20 MED ORDER — DIPHENHYDRAMINE HCL 25 MG PO CAPS
50.0000 mg | ORAL_CAPSULE | Freq: Once | ORAL | Status: AC | PRN
  Administered 2019-10-20: 50 mg via ORAL
  Filled 2019-10-20: qty 2

## 2019-10-20 MED ORDER — WARFARIN SODIUM 5 MG PO TABS
10.0000 mg | ORAL_TABLET | Freq: Once | ORAL | Status: AC
  Administered 2019-10-20: 10 mg via ORAL
  Filled 2019-10-20: qty 2

## 2019-10-20 MED ORDER — KETOROLAC TROMETHAMINE 30 MG/ML IJ SOLN
30.0000 mg | Freq: Once | INTRAMUSCULAR | Status: AC
  Administered 2019-10-20: 30 mg via INTRAVENOUS
  Filled 2019-10-20: qty 1

## 2019-10-20 MED ORDER — WARFARIN - PHARMACIST DOSING INPATIENT: Freq: Every day | Status: DC

## 2019-10-20 NOTE — Progress Notes (Signed)
ANTICOAGULATION CONSULT NOTE   Pharmacy Consult for heparin>> warfarin Indication: pulmonary embolus  Allergies  Allergen Reactions   Haloperidol Lactate Other (See Comments)   Penicillins Hives    Patient Measurements: Height: 6' (182.9 cm) Weight: 114.2 kg (251 lb 12.3 oz) IBW/kg (Calculated) : 77.6 Heparin Dosing Weight: 102.2kg  Vital Signs: Temp: 98.6 F (37 C) (10/09 2153) Temp Source: Oral (10/09 2153) BP: 143/83 (10/09 2153) Pulse Rate: 93 (10/09 2153)  Labs: Recent Labs    10/19/19 1419 10/19/19 1544 10/20/19 0109 10/20/19 1116 10/20/19 2204  HGB 13.5  --  12.5*  --   --   HCT 42.0  --  38.7*  --   --   PLT 252  --  287  --   --   APTT 26  --   --   --   --   LABPROT 12.7  --   --   --   --   INR 1.0  --   --   --   --   HEPARINUNFRC  --   --  0.30 <0.10* 0.45  CREATININE 0.73  --  0.87  --   --   TROPONINIHS 75* 63*  --   --   --     Estimated Creatinine Clearance: 163.4 mL/min (by C-G formula based on SCr of 0.87 mg/dL).   Medical History: Past Medical History:  Diagnosis Date   Chronic back pain    Depression    Rhabdomyolysis 08/21/2019    Assessment: 29 year old male presenting with cough and pleuritic chest pain found to have bilateral PE with evidence of mild R heart strain.  -heparin level at goal on 2100 units/hr    Goal of Therapy:  INR 2-3 Heparin level 0.3-0.7 units/ml Monitor platelets by anticoagulation protocol: Yes   Plan:  -No heparin changes needed -Daily heparin level and CBC  Harland German, PharmD Clinical Pharmacist **Pharmacist phone directory can now be found on [email or web address] ([email or web address]).  Listed under [facility].

## 2019-10-20 NOTE — TOC Initial Note (Signed)
Transition of Care [facility]) - Initial/Assessment Note    Patient Details  Name: Andrew [name] MRN: [id number] Date of Birth: 1990/06/01  [facility] CM/SW Contact:    Lawerance Sabal, RN Phone Number: 10/20/2019, 2:47 PM  Clinical Narrative:       Spoke w patient, he verified demographics, is uninsured. [facility] [employer]. Pays for medications out of pocket. He lives with his grandmother. He provides his own transportation. TOC will continue to follow.              Expected Discharge Plan: Home/Self Care Barriers to Discharge: Continued Medical Work up   Patient Goals and CMS Choice        Expected Discharge Plan and Services Expected Discharge Plan: Home/Self Care   Discharge Planning Services: CM Consult                                          Prior Living Arrangements/Services   Lives with:: Relatives                   Activities of Daily Living Home Assistive Devices/Equipment: None ADL Screening (condition at time of admission) Patient's cognitive ability adequate to safely complete daily activities?: Yes Is the patient deaf or have difficulty hearing?: No Does the patient have difficulty seeing, even when wearing glasses/contacts?: No Does the patient have difficulty concentrating, remembering, or making decisions?: No Patient able to express need for assistance with ADLs?: Yes Does the patient have difficulty dressing or bathing?: No Independently performs ADLs?: Yes (appropriate for developmental age) Does the patient have difficulty walking or climbing stairs?: No Weakness of Legs: None Weakness of Arms/Hands: None  Permission Sought/Granted                  Emotional Assessment              Admission diagnosis:  Bilateral pulmonary embolism (HCC) [I26.99] Community acquired pneumonia, unspecified laterality [J18.9] Other acute pulmonary embolism, unspecified whether acute cor  pulmonale present (HCC) [I26.99] Patient Active Problem List   Diagnosis Date Noted  . Bilateral pulmonary embolism (HCC) 10/19/2019  . History of rhabdomyolysis 10/19/2019  . Multifocal pneumonia 10/19/2019  . Left leg weakness 08/21/2019  . Cold left foot 08/21/2019  . Polysubstance abuse (HCC) 08/21/2019   PCP:  Patient, No Pcp Per Pharmacy:   [facility] - [place], [place] - [address] [place] [address] Phone: [phone] Fax: [phone]  [id number] [place], [place] - [address] [place] [address] Phone: [phone] Fax: [phone]     Social Determinants of Health (SDOH) Interventions    Readmission Risk Interventions No flowsheet data found.

## 2019-10-20 NOTE — Progress Notes (Addendum)
ANTICOAGULATION CONSULT NOTE - Initial Consult  Pharmacy Consult for heparin>> warfarin Indication: pulmonary embolus  Allergies  Allergen Reactions  . Haloperidol Lactate Other (See Comments)  . Penicillins Hives    Patient Measurements: Height: 6' (182.9 cm) Weight: 114.2 kg (251 lb 12.3 oz) IBW/kg (Calculated) : 77.6 Heparin Dosing Weight: 102.2kg  Vital Signs: Temp: 98.2 F (36.8 C) (10/09 1200) BP: 128/68 (10/09 1200) Pulse Rate: 81 (10/09 1200)  Labs: Recent Labs    10/19/19 1419 10/19/19 1544 10/20/19 0109 10/20/19 1116  HGB 13.5  --  12.5*  --   HCT 42.0  --  38.7*  --   PLT 252  --  287  --   APTT 26  --   --   --   LABPROT 12.7  --   --   --   INR 1.0  --   --   --   HEPARINUNFRC  --   --  0.30 <0.10*  CREATININE 0.73  --  0.87  --   TROPONINIHS 75* 63*  --   --     Estimated Creatinine Clearance: 163.4 mL/min (by C-G formula based on SCr of 0.87 mg/dL).   Medical History: Past Medical History:  Diagnosis Date  . Chronic back pain   . Depression   . Rhabdomyolysis 08/21/2019    Assessment: 29 year old male presenting with cough and pleuritic chest pain found to have bilateral PE with evidence of mild R heart strain. He remains hemodynamically stable. Currently heparin is running at 1800units/hr. He was previously therapeutic on this rate. Per RN, the line sometimes occludes as the patient moves around in bed and the line needs to be adjusted. This has happened several times during the shift. No other problems noted with infusion and no s/sx of bleeding reported.   HL subtherapeutic (<0.10)  CBC Hgb 12.5; Plt 287  -----------------------------------  Due to lack of insurance coverage, pt is also not a good candidate for DOACs. Therefore, pharmacy has now been consulted to bridge patient to warfarin therapy for oral anticoagulation. Pt is 29 year old male, 114kg with PE.   Goal of Therapy:  INR 2-3 Heparin level 0.3-0.7 units/ml Monitor  platelets by anticoagulation protocol: Yes   Plan:  Increase heparin infusion to 2100 units/hr  Repeat heparin level in 6h @1930  Monitor s/sx bleeding, HL, cbc, and renal func  Initiate warfarin 10mg  by mouth today at 1600 Plan to overlap with heparin x5 days and until INR therapeutic x24hours  Continue to monitor HL and if persistently subtherapeutic consider discussion with pt about lovenox injections.    , PharmD PGY1 Pharmacy Resident 10/20/2019 12:54 PM

## 2019-10-20 NOTE — Progress Notes (Signed)
ANTICOAGULATION CONSULT NOTE  Pharmacy Consult for heparin Indication: pulmonary embolus  Allergies  Allergen Reactions  . Haloperidol Lactate Other (See Comments)  . Penicillins Hives    Patient Measurements: Height: 6' (182.9 cm) Weight: 114.2 kg (251 lb 12.3 oz) IBW/kg (Calculated) : 77.6 Heparin Dosing Weight: 101 kg  Vital Signs: Temp: 98.7 F (37.1 C) (10/08 2053) Temp Source: Oral (10/08 2053) BP: 130/76 (10/08 2053) Pulse Rate: 85 (10/08 2053)  Labs: Recent Labs    10/19/19 1419 10/19/19 1544 10/20/19 0109  HGB 13.5  --  12.5*  HCT 42.0  --  38.7*  PLT 252  --  287  APTT 26  --   --   LABPROT 12.7  --   --   INR 1.0  --   --   HEPARINUNFRC  --   --  0.30  CREATININE 0.73  --  0.87  TROPONINIHS 75* 63*  --     Estimated Creatinine Clearance: 163.4 mL/min (by C-G formula based on SCr of 0.87 mg/dL).  Assessment: 29 y.o. male with PE for heparin  Goal of Therapy:  Heparin level 0.3-0.7 units/ml Monitor platelets by anticoagulation protocol: Yes   Plan:  Continue Heparin at current rate  Recheck level in am to verify F/U plan for oral anticoagulation  Andrew [name], Gary Fleet 10/20/2019,2:21 AM

## 2019-10-20 NOTE — Progress Notes (Addendum)
Bilateral lower extremity venous duplex completed. Refer to "CV Proc" under chart review to view preliminary results.  10/20/2019 2:44 PM Eula Fried., MHA, RVT, RDCS, RDMS \

## 2019-10-20 NOTE — Progress Notes (Signed)
  Echocardiogram 2D Echocardiogram with 3D has been performed.  Leta Jungling M 10/20/2019, 1:17 PM

## 2019-10-20 NOTE — Progress Notes (Signed)
PROGRESS NOTE    Andrew [name]  WGN:[id number] DOB: 02/23/1990 DOA: 10/19/2019 PCP: Patient, No Pcp Per   Brief Narrative:  Andrew ReadyDelvin Montrel Hitch is a 29 y.o. male with medical history significant of depression, schizophrenia, back pain, polysubstance use (heroin, marijuana, tobacco, alcohol), and hospitalization from August 10 to August 15 for rhabdo my lysis and compartment syndrome (did not require surgical intervention) who presented with 3 days of productive cough and worsening dyspnea. As above, symptoms were present for about 3 days. He states he has had associated intermittent trace bloody sputum if he has a deep cough and he experiences pleuritic chest pain whenever he inhales deeply. He also endorses a decreased appetite. He denies nausea, vomiting, fever, abdominal pain, constipation, diarrhea. In ED: D-dimer 2.99; CTA PE study showed bilateral PE involving segmental branches of the lower lobes with slight right ventricular enlargement which may represent a degree of strain; CTA also redemonstrated multifocal pneumonia possibly atypical in etiology.    Assessment & Plan:   Principal Problem:   Bilateral pulmonary embolism (HCC) Active Problems:   Polysubstance abuse (HCC)   History of rhabdomyolysis   Multifocal pneumonia  Acute hypoxic respiratory failure in the setting of bilateral pulmonary embolism:  -CTA positive for bilateral filling defects  -Currently on heparin drip, transition to Coumadin, unclear if patient has insurance, not on file  -could easily transition to Eliquis or Xarelto with coupon card, defer to case management for assistance  -Could consider home Lovenox dosing will transition and early discharge however given patient has no PCP or safe follow-up disposition location I am less comfortable with this option and would rather keep patient in the hospital until INR is therapeutic. - Lower extremity Dopplers/Echocardiogram still pending  Multifocal  pneumonia, POA:  -Patient does not meet sepsis criteria, mild leukocytosis initially but without fever tachycardia or tachypnea  - Continue ceftriaxone and azithromycin, low threshold to discontinue early given this could be edema from PE.  Polysubstance use: - Reports snorting heroin 3 days prior to admission he denies injecting any medications -Also admits to marijuana alcohol and tobacco use/abuse - Continue CIWA protocol - Nicotine patch  History of schizophrenia - Continue mirtazapine  History of rhabdomyolysis with residual nerve pain - Continue gabapentin  DVT prophylaxis:  Heparin gtt Code Status:   Full Family Communication: None present  Status is: Inpatient  Dispo: The patient is from: Home              Anticipated d/c is to: Home              Anticipated d/c date is: 24 to 48 hours pending clinical course              Patient currently not medically stable for discharge given ongoing need for IV heparin in the setting of bilateral PE and transition to p.o. anticoagulation prior to discharge safely.  Consultants:   None  Procedures:   None  Antimicrobials:  Azithromycin, ceftriaxone through 10/23/2019  Subjective: No acute issues or events overnight, patient markedly noncompliant with exam this morning, stating "no" to all questions despite my attempts to reorient the patient.  He appears to be easily arousable but otherwise noncompliant.  Objective: Vitals:   10/19/19 1800 10/19/19 1816 10/19/19 2053 10/20/19 0718  BP: 111/72 122/75 130/76 121/73  Pulse: 89  85 92  Resp: (!) 30 (!) 24 20 20   Temp:   98.7 F (37.1 C) 98.4 F (36.9 C)  TempSrc:   Oral  SpO2: 100% 98% 100% 99%  Weight:   114.2 kg   Height:   6' (1.829 m)     Intake/Output Summary (Last 24 hours) at 10/20/2019 0804 Last data filed at 10/19/2019 2300 Gross per 24 hour  Intake --  Output 2 ml  Net -2 ml   Filed Weights   10/19/19 1311 10/19/19 2053  Weight: 112.5 kg 114.2 kg     Examination:  General:  Pleasantly resting in bed, No acute distress. HEENT:  Normocephalic atraumatic.  Sclerae nonicteric, noninjected.  Extraocular movements intact bilaterally. Neck:  Without mass or deformity.  Trachea is midline. Lungs:  Clear to auscultate bilaterally without rhonchi, wheeze, or rales. Heart:  Regular rate and rhythm.  Without murmurs, rubs, or gallops. Abdomen:  Soft, nontender, nondistended.  Without guarding or rebound. Extremities: Without cyanosis, clubbing, edema, or obvious deformity. Vascular:  Dorsalis pedis and posterior tibial pulses palpable bilaterally. Skin:  Warm and dry, no erythema, no ulcerations.  Data Reviewed: I have personally reviewed following labs and imaging studies  CBC: Recent Labs  Lab 10/19/19 1419 10/20/19 0109  WBC 10.9* 10.9*  NEUTROABS 8.1*  --   HGB 13.5 12.5*  HCT 42.0 38.7*  MCV 84.5 85.2  PLT 252 287   Basic Metabolic Panel: Recent Labs  Lab 10/19/19 1419 10/20/19 0109  NA 137 139  K 3.6 3.5  CL 99 103  CO2 25 27  GLUCOSE 98 118*  BUN 7 7  CREATININE 0.73 0.87  CALCIUM 8.9 8.8*   GFR: Estimated Creatinine Clearance: 163.4 mL/min (by C-G formula based on SCr of 0.87 mg/dL). Liver Function Tests: Recent Labs  Lab 10/20/19 0109  AST 22  ALT 23  ALKPHOS 52  BILITOT 0.7  PROT 6.4*  ALBUMIN 3.0*   No results for input(s): LIPASE, AMYLASE in the last 168 hours. No results for input(s): AMMONIA in the last 168 hours. Coagulation Profile: Recent Labs  Lab 10/19/19 1419  INR 1.0   Cardiac Enzymes: No results for input(s): CKTOTAL, CKMB, CKMBINDEX, TROPONINI in the last 168 hours. BNP (last 3 results) No results for input(s): PROBNP in the last 8760 hours. HbA1C: No results for input(s): HGBA1C in the last 72 hours. CBG: No results for input(s): GLUCAP in the last 168 hours. Lipid Profile: No results for input(s): CHOL, HDL, LDLCALC, TRIG, CHOLHDL, LDLDIRECT in the last 72 hours. Thyroid  Function Tests: No results for input(s): TSH, T4TOTAL, FREET4, T3FREE, THYROIDAB in the last 72 hours. Anemia Panel: No results for input(s): VITAMINB12, FOLATE, FERRITIN, TIBC, IRON, RETICCTPCT in the last 72 hours. Sepsis Labs: Recent Labs  Lab 10/19/19 1614  LATICACIDVEN 1.8    Recent Results (from the past 240 hour(s))  Respiratory Panel by RT PCR (Flu A&B, Covid) - Nasopharyngeal Swab     Status: None   Collection Time: 10/19/19  2:37 PM   Specimen: Nasopharyngeal Swab  Result Value Ref Range Status   SARS Coronavirus 2 by RT PCR NEGATIVE NEGATIVE Final    Comment: (NOTE) SARS-CoV-2 target nucleic acids are NOT DETECTED.  The SARS-CoV-2 RNA is generally detectable in upper respiratoy specimens during the acute phase of infection. The lowest concentration of SARS-CoV-2 viral copies this assay can detect is 131 copies/mL. A negative result does not preclude SARS-Cov-2 infection and should not be used as the sole basis for treatment or other patient management decisions. A negative result may occur with  improper specimen collection/handling, submission of specimen other than nasopharyngeal swab, presence of viral mutation(s) within  the areas targeted by this assay, and inadequate number of viral copies (<131 copies/mL). A negative result must be combined with clinical observations, patient history, and epidemiological information. The expected result is Negative.  Fact Sheet for Patients:  https://www.moore.com/  Fact Sheet for Healthcare Providers:  https://www.young.biz/  This test is no t yet approved or cleared by the [place] FDA and  has been authorized for detection and/or diagnosis of SARS-CoV-2 by FDA under an Emergency Use Authorization (EUA). This EUA will remain  in effect (meaning this test can be used) for the duration of the COVID-19 declaration under Section 564(b)(1) of the Act, 21 U.S.C. section  360bbb-3(b)(1), unless the authorization is terminated or revoked sooner.     Influenza A by PCR NEGATIVE NEGATIVE Final   Influenza B by PCR NEGATIVE NEGATIVE Final    Comment: (NOTE) The Xpert Xpress SARS-CoV-2/FLU/RSV assay is intended as an aid in  the diagnosis of influenza from Nasopharyngeal swab specimens and  should not be used as a sole basis for treatment. Nasal washings and  aspirates are unacceptable for Xpert Xpress SARS-CoV-2/FLU/RSV  testing.  Fact Sheet for Patients: https://www.moore.com/  Fact Sheet for Healthcare Providers: https://www.young.biz/  This test is not yet approved or cleared by the [place] FDA and  has been authorized for detection and/or diagnosis of SARS-CoV-2 by  FDA under an Emergency Use Authorization (EUA). This EUA will remain  in effect (meaning this test can be used) for the duration of the  Covid-19 declaration under Section 564(b)(1) of the Act, 21  U.S.C. section 360bbb-3(b)(1), unless the authorization is  terminated or revoked. Performed at [facility], [address] [place], [place] [address]   Blood culture (routine x 2)     Status: None (Preliminary result)   Collection Time: 10/19/19  4:57 PM   Specimen: Left Antecubital; Blood  Result Value Ref Range Status   Specimen Description   Final    LEFT ANTECUBITAL Performed at [facility], [address] [place], [place] [address]    Special Requests   Final    BOTTLES DRAWN AEROBIC AND ANAEROBIC Blood Culture adequate volume Performed at [facility], [address] [place], [place] [address]    Culture   Final    NO GROWTH < 12 HOURS Performed at [facility], [address] [place], [place] [address]    Report Status PENDING  Incomplete  MRSA PCR Screening     Status: None   Collection Time: 10/19/19 10:58 PM   Specimen: Nasal Mucosa; Nasopharyngeal  Result Value Ref Range Status    MRSA by PCR NEGATIVE NEGATIVE Final    Comment:        The GeneXpert MRSA Assay (FDA approved for NASAL specimens only), is one component of a comprehensive MRSA colonization surveillance program. It is not intended to diagnose MRSA infection nor to guide or monitor treatment for MRSA infections. Performed at [facility], [address] [place], [place] [address]      Radiology Studies: CT Angio Chest PE W and/or Wo Contrast  Result Date: 10/19/2019 CLINICAL DATA:  29 year old male with concern for pulmonary embolism. EXAM: CT ANGIOGRAPHY CHEST WITH CONTRAST TECHNIQUE: Multidetector CT imaging of the chest was performed using the standard protocol during bolus administration of intravenous contrast. Multiplanar CT image reconstructions and MIPs were obtained to evaluate the vascular anatomy. CONTRAST:  OMNIPAQUE IOHEXOL 350 MG/ML SOLN COMPARISON:  Chest  radiograph dated 10/19/2019. FINDINGS: Cardiovascular: There is no cardiomegaly or pericardial effusion. There is mild dilatation of the right ventricle. The right ventricular lumen measures approximately 4.9 cm in diameter and the left ventricular lumen measures 4.1 cm with a RV/LV ratio of 1.2. The thoracic aorta is unremarkable. The origins of the great vessels of the aortic arch appear patent. Mild dilatation of main pulmonary trunk may be related to a degree of hypertension. There are bilateral pulmonary artery emboli primarily involving the segmental branches of the lower lobes. Mediastinum/Nodes: Right hilar adenopathy measuring 14 mm in short axis. Mildly enlarged subcarinal lymph nodes measure up to 15 mm. The esophagus and the thyroid gland are grossly unremarkable. No mediastinal fluid collection. Lungs/Pleura: Bilateral nodular and ground-glass opacities most consistent with multifocal pneumonia, likely atypical in etiology and may represent fungal infection, or other atypical agent. Clinical correlation is recommended.  There is a small cavitary nodule in the left upper lobe (134/6). Septic emboli are not excluded. Upper Abdomen: No acute abnormality. Musculoskeletal: No chest wall abnormality. No acute or significant osseous findings. Review of the MIP images confirms the above findings. IMPRESSION: 1. Bilateral pulmonary artery emboli primarily involving segmental branches of the lower lobes. There is slight enlargement the right ventricle which may represent a degree of straining. Clinical correlation is recommended. 2. Multifocal pneumonia, possibly fungal or atypical in etiology. These results were called by telephone at the time of interpretation on 10/19/2019 at 4:27 pm to Dr Tilden Fossa, who verbally acknowledged these results. Electronically Signed   By: Elgie Collard M.D.   On: 10/19/2019 16:36   DG Chest Portable 1 View  Result Date: 10/19/2019 CLINICAL DATA:  Cough and shortness of breath. EXAM: PORTABLE CHEST 1 VIEW COMPARISON:  Chest x-ray dated February 04, 2010. FINDINGS: The heart size and mediastinal contours are within normal limits. Normal pulmonary vascularity. Patchy consolidation in the right greater than left upper lobes and right lung base. No pleural effusion or pneumothorax. No acute osseous abnormality. IMPRESSION: 1. Multifocal pneumonia. Electronically Signed   By: Obie Dredge M.D.   On: 10/19/2019 13:55        Scheduled Meds: . folic acid  1 mg Oral Daily  . gabapentin  100 mg Oral TID  . mirtazapine  15 mg Oral QHS  . multivitamin with minerals  1 tablet Oral Daily  . nicotine  21 mg Transdermal Q2200  . sodium chloride flush  3 mL Intravenous Q12H  . thiamine  100 mg Oral Daily   Continuous Infusions: . sodium chloride 10 mL/hr at 10/19/19 1713  . azithromycin    . cefTRIAXone (ROCEPHIN)  IV    . heparin 1,800 Units/hr (10/20/19 0602)     LOS: 1 day   Time spent:  Azucena Fallen, DO [facility]  If 7PM-7AM, please contact  night-coverage [email or web address]  10/20/2019, 8:04 AM

## 2019-10-21 LAB — CBC
HCT: 35.9 % — ABNORMAL LOW (ref 39.0–52.0)
Hemoglobin: 11.3 g/dL — ABNORMAL LOW (ref 13.0–17.0)
MCH: 27.3 pg (ref 26.0–34.0)
MCHC: 31.5 g/dL (ref 30.0–36.0)
MCV: 86.7 fL (ref 80.0–100.0)
Platelets: 238 10*3/uL (ref 150–400)
RBC: 4.14 MIL/uL — ABNORMAL LOW (ref 4.22–5.81)
RDW: 12.5 % (ref 11.5–15.5)
WBC: 8.9 10*3/uL (ref 4.0–10.5)
nRBC: 0 % (ref 0.0–0.2)

## 2019-10-21 LAB — PROTIME-INR
INR: 1 (ref 0.8–1.2)
Prothrombin Time: 12.9 seconds (ref 11.4–15.2)

## 2019-10-21 LAB — HEPARIN LEVEL (UNFRACTIONATED)
Heparin Unfractionated: 0.31 IU/mL (ref 0.30–0.70)
Heparin Unfractionated: 0.56 IU/mL (ref 0.30–0.70)

## 2019-10-21 MED ORDER — WARFARIN SODIUM 5 MG PO TABS
10.0000 mg | ORAL_TABLET | Freq: Once | ORAL | Status: AC
  Administered 2019-10-21: 10 mg via ORAL
  Filled 2019-10-21: qty 2

## 2019-10-21 MED ORDER — KETOROLAC TROMETHAMINE 15 MG/ML IJ SOLN
30.0000 mg | Freq: Once | INTRAMUSCULAR | Status: AC | PRN
  Administered 2019-10-21: 30 mg via INTRAVENOUS
  Filled 2019-10-21: qty 2

## 2019-10-21 NOTE — Progress Notes (Signed)
PROGRESS NOTE    Andrew [name]  RSW:[id number] DOB: Mar 22, 1990 DOA: 10/19/2019 PCP: Patient, No Pcp Per   Brief Narrative:  Andrew [name] is a 29 y.o. male with medical history significant of depression, schizophrenia, back pain, polysubstance use (heroin, marijuana, tobacco, alcohol), and hospitalization from August 10 to August 15 for rhabdo my lysis and compartment syndrome (did not require surgical intervention) who presented with 3 days of productive cough and worsening dyspnea. As above, symptoms were present for about 3 days. He states he has had associated intermittent trace bloody sputum if he has a deep cough and he experiences pleuritic chest pain whenever he inhales deeply. He also endorses a decreased appetite. He denies nausea, vomiting, fever, abdominal pain, constipation, diarrhea. In ED: D-dimer 2.99; CTA PE study showed bilateral PE involving segmental branches of the lower lobes with slight right ventricular enlargement which may represent a degree of strain; CTA also redemonstrated multifocal pneumonia possibly atypical in etiology.    Assessment & Plan:   Principal Problem:   Bilateral pulmonary embolism (HCC) Active Problems:   Polysubstance abuse (HCC)   History of rhabdomyolysis   Multifocal pneumonia   Acute hypoxic respiratory failure in the setting of bilateral pulmonary embolism:  -CTA positive for bilateral filling defects  -Currently on heparin drip, transition to Coumadin -follow INR -Could consider home Lovenox dosing will transition and early discharge however given patient has no PCP or safe follow-up disposition location I am less comfortable with this option and would rather keep patient in the hospital until INR is therapeutic. -Echocardiogram unremarkable, DVT study pending.  Multifocal pneumonia, POA:  -Patient does not meet sepsis criteria, mild leukocytosis initially but without fever tachycardia or tachypnea  - Continue  ceftriaxone and azithromycin, low threshold to discontinue early given this could be edema from PE.  Polysubstance use: - Reports snorting heroin 3 days prior to admission-  denies injecting any medications -Also admits to marijuana alcohol and tobacco use/abuse - Continue CIWA protocol - Nicotine patch  History of schizophrenia - Continue mirtazapine  History of rhabdomyolysis with residual nerve pain - Continue gabapentin  DVT prophylaxis:  Heparin gtt Code Status:   Full Family Communication: None present  Status is: Inpatient  Dispo: The patient is from: Home              Anticipated d/c is to: Home              Anticipated d/c date is: 24 to 48 hours pending clinical course              Patient currently not medically stable for discharge given ongoing need for IV heparin in the setting of bilateral PE and transition to p.o. anticoagulation prior to discharge safely.  Consultants:   None  Procedures:   None  Antimicrobials:  Azithromycin, ceftriaxone through 10/23/2019  Subjective: No acute issues or events overnight, patient denies chest pain, shortness of breath, nausea, vomiting, diarrhea, constipation, headache, fevers, chills.  Objective: Vitals:   10/20/19 1200 10/20/19 1618 10/20/19 1618 10/20/19 2153  BP: 128/68 119/78 119/78 (!) 143/83  Pulse: 81  94 93  Resp: 18 20 20 20   Temp: 98.2 F (36.8 C) 98.9 F (37.2 C) 98.9 F (37.2 C) 98.6 F (37 C)  TempSrc:    Oral  SpO2:   100% 100%  Weight:      Height:        Intake/Output Summary (Last 24 hours) at 10/21/2019 0737 Last data filed at 10/21/2019  [id number] Gross per 24 hour  Intake 1073.64 ml  Output --  Net 1073.64 ml   Filed Weights   10/19/19 1311 10/19/19 2053  Weight: 112.5 kg 114.2 kg    Examination:  General:  Pleasantly resting in bed, No acute distress. HEENT:  Normocephalic atraumatic.  Sclerae nonicteric, noninjected.  Extraocular movements intact bilaterally. Neck:  Without  mass or deformity.  Trachea is midline. Lungs:  Clear to auscultate bilaterally without rhonchi, wheeze, or rales. Heart:  Regular rate and rhythm.  Without murmurs, rubs, or gallops. Abdomen:  Soft, nontender, nondistended.  Without guarding or rebound. Extremities: Without cyanosis, clubbing, edema, or obvious deformity. Vascular:  Dorsalis pedis and posterior tibial pulses palpable bilaterally. Skin:  Warm and dry, no erythema, no ulcerations.  Data Reviewed: I have personally reviewed following labs and imaging studies  CBC: Recent Labs  Lab 10/19/19 1419 10/20/19 0109 10/21/19 0252  WBC 10.9* 10.9* 8.9  NEUTROABS 8.1*  --   --   HGB 13.5 12.5* 11.3*  HCT 42.0 38.7* 35.9*  MCV 84.5 85.2 86.7  PLT 252 287 238   Basic Metabolic Panel: Recent Labs  Lab 10/19/19 1419 10/20/19 0109  NA 137 139  K 3.6 3.5  CL 99 103  CO2 25 27  GLUCOSE 98 118*  BUN 7 7  CREATININE 0.73 0.87  CALCIUM 8.9 8.8*   GFR: Estimated Creatinine Clearance: 163.4 mL/min (by C-G formula based on SCr of 0.87 mg/dL). Liver Function Tests: Recent Labs  Lab 10/20/19 0109  AST 22  ALT 23  ALKPHOS 52  BILITOT 0.7  PROT 6.4*  ALBUMIN 3.0*   No results for input(s): LIPASE, AMYLASE in the last 168 hours. No results for input(s): AMMONIA in the last 168 hours. Coagulation Profile: Recent Labs  Lab 10/19/19 1419 10/21/19 0252  INR 1.0 1.0   Cardiac Enzymes: No results for input(s): CKTOTAL, CKMB, CKMBINDEX, TROPONINI in the last 168 hours. BNP (last 3 results) No results for input(s): PROBNP in the last 8760 hours. HbA1C: No results for input(s): HGBA1C in the last 72 hours. CBG: No results for input(s): GLUCAP in the last 168 hours. Lipid Profile: No results for input(s): CHOL, HDL, LDLCALC, TRIG, CHOLHDL, LDLDIRECT in the last 72 hours. Thyroid Function Tests: No results for input(s): TSH, T4TOTAL, FREET4, T3FREE, THYROIDAB in the last 72 hours. Anemia Panel: No results for input(s):  VITAMINB12, FOLATE, FERRITIN, TIBC, IRON, RETICCTPCT in the last 72 hours. Sepsis Labs: Recent Labs  Lab 10/19/19 1614  LATICACIDVEN 1.8    Recent Results (from the past 240 hour(s))  Respiratory Panel by RT PCR (Flu A&B, Covid) - Nasopharyngeal Swab     Status: None   Collection Time: 10/19/19  2:37 PM   Specimen: Nasopharyngeal Swab  Result Value Ref Range Status   SARS Coronavirus 2 by RT PCR NEGATIVE NEGATIVE Final    Comment: (NOTE) SARS-CoV-2 target nucleic acids are NOT DETECTED.  The SARS-CoV-2 RNA is generally detectable in upper respiratoy specimens during the acute phase of infection. The lowest concentration of SARS-CoV-2 viral copies this assay can detect is 131 copies/mL. A negative result does not preclude SARS-Cov-2 infection and should not be used as the sole basis for treatment or other patient management decisions. A negative result may occur with  improper specimen collection/handling, submission of specimen other than nasopharyngeal swab, presence of viral mutation(s) within the areas targeted by this assay, and inadequate number of viral copies (<131 copies/mL). A negative result must be combined with clinical observations,  patient history, and epidemiological information. The expected result is Negative.  Fact Sheet for Patients:  https://www.moore.com/  Fact Sheet for Healthcare Providers:  https://www.young.biz/  This test is no t yet approved or cleared by the [place] FDA and  has been authorized for detection and/or diagnosis of SARS-CoV-2 by FDA under an Emergency Use Authorization (EUA). This EUA will remain  in effect (meaning this test can be used) for the duration of the COVID-19 declaration under Section 564(b)(1) of the Act, 21 U.S.C. section 360bbb-3(b)(1), unless the authorization is terminated or revoked sooner.     Influenza A by PCR NEGATIVE NEGATIVE Final   Influenza B by PCR NEGATIVE  NEGATIVE Final    Comment: (NOTE) The Xpert Xpress SARS-CoV-2/FLU/RSV assay is intended as an aid in  the diagnosis of influenza from Nasopharyngeal swab specimens and  should not be used as a sole basis for treatment. Nasal washings and  aspirates are unacceptable for Xpert Xpress SARS-CoV-2/FLU/RSV  testing.  Fact Sheet for Patients: https://www.moore.com/  Fact Sheet for Healthcare Providers: https://www.young.biz/  This test is not yet approved or cleared by the [place] FDA and  has been authorized for detection and/or diagnosis of SARS-CoV-2 by  FDA under an Emergency Use Authorization (EUA). This EUA will remain  in effect (meaning this test can be used) for the duration of the  Covid-19 declaration under Section 564(b)(1) of the Act, 21  U.S.C. section 360bbb-3(b)(1), unless the authorization is  terminated or revoked. Performed at [facility], [address] [place], [place] [address]   Blood culture (routine x 2)     Status: None (Preliminary result)   Collection Time: 10/19/19  4:57 PM   Specimen: Left Antecubital; Blood  Result Value Ref Range Status   Specimen Description   Final    LEFT ANTECUBITAL Performed at [facility], [address] [place], [place] [address]    Special Requests   Final    BOTTLES DRAWN AEROBIC AND ANAEROBIC Blood Culture adequate volume Performed at [facility], [address] [place], [place] [address]    Culture   Final    NO GROWTH < 12 HOURS Performed at [facility], [address] [place], [place] [address]    Report Status PENDING  Incomplete  MRSA PCR Screening     Status: None   Collection Time: 10/19/19 10:58 PM   Specimen: Nasal Mucosa; Nasopharyngeal  Result Value Ref Range Status   MRSA by PCR NEGATIVE NEGATIVE Final    Comment:        The GeneXpert MRSA Assay (FDA approved for NASAL specimens only), is one component of  a comprehensive MRSA colonization surveillance program. It is not intended to diagnose MRSA infection nor to guide or monitor treatment for MRSA infections. Performed at [facility], [address], [place], [place] [address]      Radiology Studies: CT Angio Chest PE W and/or Wo Contrast  Result Date: 10/19/2019 CLINICAL DATA:  29 year old male with concern for pulmonary embolism. EXAM: CT ANGIOGRAPHY CHEST WITH CONTRAST TECHNIQUE: Multidetector CT imaging of the chest was performed using the standard protocol during bolus administration of intravenous contrast. Multiplanar CT image reconstructions and MIPs were obtained to evaluate the vascular anatomy. CONTRAST:  OMNIPAQUE IOHEXOL 350 MG/ML SOLN COMPARISON:  Chest radiograph dated 10/19/2019. FINDINGS: Cardiovascular: There is no cardiomegaly or pericardial effusion. There is mild dilatation of the right ventricle. The right ventricular  lumen measures approximately 4.9 cm in diameter and the left ventricular lumen measures 4.1 cm with a RV/LV ratio of 1.2. The thoracic aorta is unremarkable. The origins of the great vessels of the aortic arch appear patent. Mild dilatation of main pulmonary trunk may be related to a degree of hypertension. There are bilateral pulmonary artery emboli primarily involving the segmental branches of the lower lobes. Mediastinum/Nodes: Right hilar adenopathy measuring 14 mm in short axis. Mildly enlarged subcarinal lymph nodes measure up to 15 mm. The esophagus and the thyroid gland are grossly unremarkable. No mediastinal fluid collection. Lungs/Pleura: Bilateral nodular and ground-glass opacities most consistent with multifocal pneumonia, likely atypical in etiology and may represent fungal infection, or other atypical agent. Clinical correlation is recommended. There is a small cavitary nodule in the left upper lobe (134/6). Septic emboli are not excluded. Upper Abdomen: No acute abnormality.  Musculoskeletal: No chest wall abnormality. No acute or significant osseous findings. Review of the MIP images confirms the above findings. IMPRESSION: 1. Bilateral pulmonary artery emboli primarily involving segmental branches of the lower lobes. There is slight enlargement the right ventricle which may represent a degree of straining. Clinical correlation is recommended. 2. Multifocal pneumonia, possibly fungal or atypical in etiology. These results were called by telephone at the time of interpretation on 10/19/2019 at 4:27 pm to Dr Tilden Fossa, who verbally acknowledged these results. Electronically Signed   By: Elgie Collard M.D.   On: 10/19/2019 16:36   DG Chest Portable 1 View  Result Date: 10/19/2019 CLINICAL DATA:  Cough and shortness of breath. EXAM: PORTABLE CHEST 1 VIEW COMPARISON:  Chest x-ray dated February 04, 2010. FINDINGS: The heart size and mediastinal contours are within normal limits. Normal pulmonary vascularity. Patchy consolidation in the right greater than left upper lobes and right lung base. No pleural effusion or pneumothorax. No acute osseous abnormality. IMPRESSION: 1. Multifocal pneumonia. Electronically Signed   By: Obie Dredge M.D.   On: 10/19/2019 13:55   ECHOCARDIOGRAM COMPLETE  Result Date: 10/20/2019    ECHOCARDIOGRAM REPORT   Patient Name:   TIYON SANOR IONGEXBM Date of Exam: 10/20/2019 Medical Rec #:  [id number]               Height:       72.0 in Accession #:    [id number]              Weight:       251.8 lb Date of Birth:  04-10-1990                BSA:          2.350 m Patient Age:    29 years                BP:           128/68 mmHg Patient Gender: M                       HR:           81 bpm. Exam Location:  Inpatient Procedure: 2D Echo, 3D Echo and Strain Analysis Indications:    Pulmonary Embolus 415.19 / I26.99  History:        Patient has prior history of Echocardiogram examinations, most                 recent 08/22/2019. Risk Factors:Current Smoker.  Polysubstance  use, Multifocal pneumonia, History of rhabdomyolysis with                 residual nerve pain.  Sonographer:    Leta Jungling RDCS Referring Phys: [id number] Cecille Po MELVIN IMPRESSIONS  1. Left ventricular ejection fraction, by estimation, is 50 to 55%. The left ventricle has low normal function. The left ventricle has no regional wall motion abnormalities. Left ventricular diastolic parameters were normal.  2. Right ventricular systolic function is low normal. The right ventricular size is normal. Tricuspid regurgitation signal is inadequate for assessing PA pressure.  3. The mitral valve is normal in structure. No evidence of mitral valve regurgitation.  4. The aortic valve is normal in structure. Aortic valve regurgitation is not visualized. No aortic stenosis is present.  5. PV Acceleration time mildly increase, suggestive of mild elevation in PAP.  6. The inferior vena cava is normal in size with greater than 50% respiratory variability, suggesting right atrial pressure of 3 mmHg. Comparison(s): Subtle decrease in LV and RV function from 8/11 study. FINDINGS  Left Ventricle: Left ventricular ejection fraction, by estimation, is 50 to 55%. The left ventricle has low normal function. The left ventricle has no regional wall motion abnormalities. The left ventricular internal cavity size was normal in size. There is no left ventricular hypertrophy. Left ventricular diastolic parameters were normal. Right Ventricle: The right ventricular size is normal. No increase in right ventricular wall thickness. Right ventricular systolic function is low normal. Tricuspid regurgitation signal is inadequate for assessing PA pressure. Left Atrium: Left atrial size was normal in size. Right Atrium: Right atrial size was normal in size. Pericardium: There is no evidence of pericardial effusion. Mitral Valve: The mitral valve is normal in structure. No evidence of mitral valve regurgitation.  Tricuspid Valve: The tricuspid valve is normal in structure. Tricuspid valve regurgitation is trivial. Aortic Valve: The aortic valve is normal in structure. Aortic valve regurgitation is not visualized. No aortic stenosis is present. Pulmonic Valve: PV Acceleration time mildly increase, suggestive of mild elevation in PAP. The pulmonic valve was not well visualized. Pulmonic valve regurgitation is not visualized. Aorta: The aortic root and ascending aorta are structurally normal, with no evidence of dilitation. Venous: The inferior vena cava is normal in size with greater than 50% respiratory variability, suggesting right atrial pressure of 3 mmHg. IAS/Shunts: The atrial septum is grossly normal.  LEFT VENTRICLE PLAX 2D LVIDd:         5.10 cm  Diastology LVIDs:         3.60 cm  LV e' medial:    9.65 cm/s LV PW:         0.90 cm  LV E/e' medial:  6.2 LV IVS:        1.00 cm  LV e' lateral:   12.70 cm/s LVOT diam:     2.00 cm  LV E/e' lateral: 4.7 LV SV:         48 LV SV Index:   20 LVOT Area:     3.14 cm                          3D Volume EF:                         3D EF:        52 % RIGHT VENTRICLE TAPSE (M-mode): 1.6 cm LEFT ATRIUM  Index LA diam:      3.50 cm 1.49 cm/m LA Vol (A2C): 36.1 ml 15.36 ml/m LA Vol (A4C): 33.8 ml 14.39 ml/m  AORTIC VALVE LVOT Vmax:   103.00 cm/s LVOT Vmean:  70.300 cm/s LVOT VTI:    0.153 m  AORTA Ao Root diam: 3.20 cm MITRAL VALVE MV Area (PHT): 3.10 cm    SHUNTS MV Decel Time: 245 msec    Systemic VTI:  0.15 m MV E velocity: 60.00 cm/s  Systemic Diam: 2.00 cm MV A velocity: 38.60 cm/s MV E/A ratio:  1.55 Riley Lam MD Electronically signed by Riley Lam MD Signature Date/Time: 10/20/2019/1:52:58 PM    Final    VAS [place] LOWER EXTREMITY VENOUS (DVT)  Result Date: 10/20/2019  Lower Venous DVTStudy Indications: Pulmonary embolism.  Comparison Study: No prior study Performing Technologist: Gertie Fey MHA, RDMS, RVT, RDCS  Examination Guidelines: A  complete evaluation includes B-mode imaging, spectral Doppler, color Doppler, and power Doppler as needed of all accessible portions of each vessel. Bilateral testing is considered an integral part of a complete examination. Limited examinations for reoccurring indications may be performed as noted. The reflux portion of the exam is performed with the patient in reverse Trendelenburg.  +---------+---------------+---------+-----------+----------+--------------+ RIGHT    CompressibilityPhasicitySpontaneityPropertiesThrombus Aging +---------+---------------+---------+-----------+----------+--------------+ CFV      Full           Yes      Yes                                 +---------+---------------+---------+-----------+----------+--------------+ SFJ      Full                                                        +---------+---------------+---------+-----------+----------+--------------+ FV Prox  Full                                                        +---------+---------------+---------+-----------+----------+--------------+ FV Mid   Full                                                        +---------+---------------+---------+-----------+----------+--------------+ FV DistalFull                                                        +---------+---------------+---------+-----------+----------+--------------+ PFV      Full                                                        +---------+---------------+---------+-----------+----------+--------------+ POP      Full           Yes  Yes                                 +---------+---------------+---------+-----------+----------+--------------+ PTV      Full                                                        +---------+---------------+---------+-----------+----------+--------------+ PERO     Full                                                         +---------+---------------+---------+-----------+----------+--------------+   +---------+---------------+---------+-----------+----------+--------------+ LEFT     CompressibilityPhasicitySpontaneityPropertiesThrombus Aging +---------+---------------+---------+-----------+----------+--------------+ CFV      Full           Yes      Yes                                 +---------+---------------+---------+-----------+----------+--------------+ SFJ      Full                                                        +---------+---------------+---------+-----------+----------+--------------+ FV Prox  Full                                                        +---------+---------------+---------+-----------+----------+--------------+ FV Mid   Full                                                        +---------+---------------+---------+-----------+----------+--------------+ FV DistalFull                                                        +---------+---------------+---------+-----------+----------+--------------+ PFV      Full                                                        +---------+---------------+---------+-----------+----------+--------------+ POP      Full           Yes      Yes                                 +---------+---------------+---------+-----------+----------+--------------+ PTV      Full                                                        +---------+---------------+---------+-----------+----------+--------------+  PERO     Full                                                        +---------+---------------+---------+-----------+----------+--------------+     Summary: RIGHT: - There is no evidence of deep vein thrombosis in the lower extremity.  - No cystic structure found in the popliteal fossa.  LEFT: - There is no evidence of deep vein thrombosis in the lower extremity.  - No cystic structure found in the popliteal fossa.   *See table(s) above for measurements and observations.    Preliminary         Scheduled Meds: . folic acid  1 mg Oral Daily  . gabapentin  100 mg Oral TID  . mirtazapine  15 mg Oral QHS  . multivitamin with minerals  1 tablet Oral Daily  . nicotine  21 mg Transdermal Q2200  . sodium chloride flush  3 mL Intravenous Q12H  . thiamine  100 mg Oral Daily  . warfarin  10 mg Oral ONCE-1600  . Warfarin - Pharmacist Dosing Inpatient   Does not apply q1600   Continuous Infusions: . sodium chloride 10 mL/hr at 10/19/19 1713  . azithromycin 500 mg (10/20/19 1756)  . cefTRIAXone (ROCEPHIN)  IV 2 g (10/20/19 1639)  . heparin 2,100 Units/hr (10/20/19 2359)     LOS: 2 days   Time spent:  Azucena Fallen, DO [facility]  If 7PM-7AM, please contact night-coverage [email or web address]  10/21/2019, 7:37 AM

## 2019-10-21 NOTE — Progress Notes (Signed)
ANTICOAGULATION CONSULT NOTE - Follow Up Consult  Pharmacy Consult for heparin >>warfarin Indication: pulmonary embolus  Allergies  Allergen Reactions  . Haloperidol Lactate Other (See Comments)  . Penicillins Hives    Patient Measurements: Height: 6' (182.9 cm) Weight: 114.2 kg (251 lb 12.3 oz) IBW/kg (Calculated) : 77.6 Heparin Dosing Weight: 102.2kg  Vital Signs: Temp: 98.1 F (36.7 C) (10/10 1600) Temp Source: Oral (10/10 1600) BP: 126/69 (10/10 1600) Pulse Rate: 75 (10/10 1600)  Labs: Recent Labs    10/19/19 1419 10/19/19 1419 10/19/19 1544 10/20/19 0109 10/20/19 1116 10/20/19 2204 10/21/19 0252 10/21/19 1527  HGB 13.5   < >  --  12.5*  --   --  11.3*  --   HCT 42.0  --   --  38.7*  --   --  35.9*  --   PLT 252  --   --  287  --   --  238  --   APTT 26  --   --   --   --   --   --   --   LABPROT 12.7  --   --   --   --   --  12.9  --   INR 1.0  --   --   --   --   --  1.0  --   HEPARINUNFRC  --   --   --  0.30   < > 0.45 0.31 0.56  CREATININE 0.73  --   --  0.87  --   --   --   --   TROPONINIHS 75*  --  63*  --   --   --   --   --    < > = values in this interval not displayed.    Estimated Creatinine Clearance: 163.4 mL/min (by C-G formula based on SCr of 0.87 mg/dL).   Assessment: 29 year old male presenting with cough and pleuritic chest pain found to have bilateral PE with evidence of mild R heart strain. He remains hemodynamically stable. Spoke with RN regarding line issue from yesterday. Per RN no problems with line reported or noticed.  Pharmacy also consulted to transition to warfarin for oral anticoagulation   CBC slight downtrend; Hgb 11.3; Plt 238  Heparin level remains therapeutic (0.56) on gtt at 2200 units/hr. No bleeding noted.  Goal of Therapy:  INR 2-3 Heparin level 0.3-0.7 units/ml Monitor platelets by anticoagulation protocol: Yes   Plan:  Continue heparin at 2200 units/hr  F/u daily heparin level  Christoper Fabian, PharmD,  BCPS Please see amion for complete clinical pharmacist phone list 10/21/2019 4:46 PM

## 2019-10-21 NOTE — Progress Notes (Signed)
ANTICOAGULATION CONSULT NOTE - Follow Up Consult  Pharmacy Consult for heparin >>warfarin Indication: pulmonary embolus  Allergies  Allergen Reactions  . Haloperidol Lactate Other (See Comments)  . Penicillins Hives    Patient Measurements: Height: 6' (182.9 cm) Weight: 114.2 kg (251 lb 12.3 oz) IBW/kg (Calculated) : 77.6 Heparin Dosing Weight: 102.2kg  Vital Signs: Temp: 98.6 F (37 C) (10/09 2153) Temp Source: Oral (10/09 2153) BP: 143/83 (10/09 2153) Pulse Rate: 93 (10/09 2153)  Labs: Recent Labs    10/19/19 1419 10/19/19 1419 10/19/19 1544 10/20/19 0109 10/20/19 0109 10/20/19 1116 10/20/19 2204 10/21/19 0252  HGB 13.5   < >  --  12.5*  --   --   --  11.3*  HCT 42.0  --   --  38.7*  --   --   --  35.9*  PLT 252  --   --  287  --   --   --  238  APTT 26  --   --   --   --   --   --   --   LABPROT 12.7  --   --   --   --   --   --  12.9  INR 1.0  --   --   --   --   --   --  1.0  HEPARINUNFRC  --   --   --  0.30   < > <0.10* 0.45 0.31  CREATININE 0.73  --   --  0.87  --   --   --   --   TROPONINIHS 75*  --  63*  --   --   --   --   --    < > = values in this interval not displayed.    Estimated Creatinine Clearance: 163.4 mL/min (by C-G formula based on SCr of 0.87 mg/dL).   Assessment: 29 year old male presenting with cough and pleuritic chest pain found to have bilateral PE with evidence of mild R heart strain. He remains hemodynamically stable. Spoke with RN regarding line issue from yesterday. Per RN no problems with line reported or noticed.  Pharmacy also consulted to transition to warfarin for oral anticoagulation   CBC slight downtrend; Hgb 11.3; Plt 238  HL therapeutic (0.31) 10/9 warfarin 10mg  x1 (INR 1.0)  Goal of Therapy:  INR 2-3 Heparin level 0.3-0.7 units/ml Monitor platelets by anticoagulation protocol: Yes   Plan:  Increase heparin slightly to 2200 units/hr to keep within therapeutic range Repeat heparin level at 1600 Monitor s/sx  bleeding, HL, and CBC  Give warfarin 10mg  by mouth x1 today  Monitor daily INR   , PharmD PGY1 Pharmacy Resident 10/21/2019 7:47 AM

## 2019-10-22 LAB — CBC
HCT: 34.2 % — ABNORMAL LOW (ref 39.0–52.0)
Hemoglobin: 10.9 g/dL — ABNORMAL LOW (ref 13.0–17.0)
MCH: 27.7 pg (ref 26.0–34.0)
MCHC: 31.9 g/dL (ref 30.0–36.0)
MCV: 86.8 fL (ref 80.0–100.0)
Platelets: 264 10*3/uL (ref 150–400)
RBC: 3.94 MIL/uL — ABNORMAL LOW (ref 4.22–5.81)
RDW: 12.6 % (ref 11.5–15.5)
WBC: 10.6 10*3/uL — ABNORMAL HIGH (ref 4.0–10.5)
nRBC: 0 % (ref 0.0–0.2)

## 2019-10-22 LAB — PROTIME-INR
INR: 1.1 (ref 0.8–1.2)
Prothrombin Time: 13.7 seconds (ref 11.4–15.2)

## 2019-10-22 LAB — HEPARIN LEVEL (UNFRACTIONATED): Heparin Unfractionated: 0.53 IU/mL (ref 0.30–0.70)

## 2019-10-22 MED ORDER — WARFARIN SODIUM 5 MG PO TABS
10.0000 mg | ORAL_TABLET | Freq: Once | ORAL | Status: AC
  Administered 2019-10-22: 10 mg via ORAL
  Filled 2019-10-22: qty 2

## 2019-10-22 NOTE — Progress Notes (Signed)
ANTICOAGULATION CONSULT NOTE - Follow Up Consult  Pharmacy Consult for heparin >>warfarin Indication: pulmonary embolus  Allergies  Allergen Reactions  . Haloperidol Lactate Other (See Comments)  . Penicillins Hives    Patient Measurements: Height: 6' (182.9 cm) Weight: 114.2 kg (251 lb 12.3 oz) IBW/kg (Calculated) : 77.6 Heparin Dosing Weight: 102.2kg  Vital Signs: Temp: 99.1 F (37.3 C) (10/10 2157) Temp Source: Oral (10/10 2157) BP: 146/91 (10/10 2157) Pulse Rate: 87 (10/10 2157)  Labs: Recent Labs    10/19/19 1419 10/19/19 1419 10/19/19 1544 10/20/19 0109 10/20/19 1116 10/21/19 0252 10/21/19 1527 10/22/19 0235  HGB 13.5   < >  --  12.5*  --  11.3*  --  10.9*  HCT 42.0   < >  --  38.7*  --  35.9*  --  34.2*  PLT 252   < >  --  287  --  238  --  264  APTT 26  --   --   --   --   --   --   --   LABPROT 12.7  --   --   --   --  12.9  --  13.7  INR 1.0  --   --   --   --  1.0  --  1.1  HEPARINUNFRC  --   --   --  0.30   < > 0.31 0.56 0.53  CREATININE 0.73  --   --  0.87  --   --   --   --   TROPONINIHS 75*  --  63*  --   --   --   --   --    < > = values in this interval not displayed.    Estimated Creatinine Clearance: 163.4 mL/min (by C-G formula based on SCr of 0.87 mg/dL).   Assessment: 29 year old male presenting with cough and pleuritic chest pain found to have bilateral PE with evidence of mild R heart strain. He remains hemodynamically stable. Spoke with RN regarding line issue from yesterday. Per RN no problems with line reported or noticed.  Pharmacy also consulted to transition to warfarin for oral anticoagulation   CBC slight downtrend; Hgb 10.9; Plt up to 264  HL therapeutic (0.53) 10/11 warfarin 10mg  x1 (INR 1.1)  Goal of Therapy:  INR 2-3 Heparin level 0.3-0.7 units/ml Monitor platelets by anticoagulation protocol: Yes   Plan:  Continue heparin at 2200 units/hr to keep within therapeutic range Monitor s/sx bleeding, HL, and CBC  daily  Give warfarin 10mg  by mouth x1 today  Monitor daily INR  , PharmD, [facility] Clinical Pharmacist Please see AMION for all Pharmacists' Contact Phone Numbers 10/22/2019, 7:46 AM

## 2019-10-22 NOTE — Progress Notes (Signed)
PROGRESS NOTE    [name]  UUV:[id number] DOB: 1990-10-30 DOA: 10/19/2019 PCP: Patient, No Pcp Per   Brief Narrative:  Andrew [name] is a 29 y.o. male with medical history significant of depression, schizophrenia, back pain, polysubstance use (heroin, marijuana, tobacco, alcohol), and hospitalization from August 10 to August 15 for rhabdo my lysis and compartment syndrome (did not require surgical intervention) who presented with 3 days of productive cough and worsening dyspnea. As above, symptoms were present for about 3 days. He states he has had associated intermittent trace bloody sputum if he has a deep cough and he experiences pleuritic chest pain whenever he inhales deeply. He also endorses a decreased appetite. He denies nausea, vomiting, fever, abdominal pain, constipation, diarrhea. In ED: D-dimer 2.99; CTA PE study showed bilateral PE involving segmental branches of the lower lobes with slight right ventricular enlargement which may represent a degree of strain; CTA also redemonstrated multifocal pneumonia possibly atypical in etiology.   Assessment & Plan:   Principal Problem:   Bilateral pulmonary embolism (HCC) Active Problems:   Polysubstance abuse (HCC)   History of rhabdomyolysis   Multifocal pneumonia  Acute hypoxic respiratory failure in the setting of bilateral pulmonary embolism:  - CTA positive for bilateral filling defects  - Currently on heparin drip, transition to Coumadin -follow INR - Could consider home Lovenox dosing will transition and early discharge however given patient has no PCP or safe follow-up disposition location I am less comfortable with this option and would rather keep patient in the hospital until INR is therapeutic. - Echocardiogram unremarkable; DVT study negative bilaterally.  Questionable multifocal pneumonia, POA:  - Patient does not meet sepsis criteria, mild leukocytosis initially but without fever tachycardia or  tachypnea - Continue ceftriaxone and azithromycin, low threshold to discontinue early given this could be edema from PE  Polysubstance use: - Reports snorting heroin 3 days prior to admission-  denies injecting any medications - Also admits to marijuana alcohol and tobacco use/abuse - Continue CIWA protocol - Nicotine patch  History of schizophrenia - Continue mirtazapine  History of rhabdomyolysis with residual nerve pain - Continue gabapentin  DVT prophylaxis:  Heparin gtt --> coumadin transitioning Code Status:   Full Family Communication: None present  Status is: Inpatient  Dispo: The patient is from: Home              Anticipated d/c is to: Home              Anticipated d/c date is: 24 to 48 hours pending clinical course              Patient currently not medically stable for discharge given ongoing need for IV heparin in the setting of bilateral PE and transition to p.o. anticoagulation prior to discharge safely.  Consultants:   None  Procedures:   None  Antimicrobials:  Azithromycin, ceftriaxone through 10/23/2019  Subjective: No acute issues or events overnight, patient denies chest pain, shortness of breath, nausea, vomiting, diarrhea, constipation, headache, fevers, chills.  Objective: Vitals:   10/21/19 0958 10/21/19 1218 10/21/19 1600 10/21/19 2157  BP: 114/61 139/79 126/69 (!) 146/91  Pulse: 71 73 75 87  Resp: 20 19 19 18   Temp: 98.9 F (37.2 C) 98 F (36.7 C) 98.1 F (36.7 C) 99.1 F (37.3 C)  TempSrc: Oral Oral Oral Oral  SpO2: 100% 95% 100% 100%  Weight:      Height:        Intake/Output Summary (Last 24 hours)  at 10/22/2019 0744 Last data filed at 10/21/2019 2206 Gross per 24 hour  Intake 240 ml  Output --  Net 240 ml   Filed Weights   10/19/19 1311 10/19/19 2053  Weight: 112.5 kg 114.2 kg    Examination:  General:  Pleasantly resting in bed, No acute distress. HEENT:  Normocephalic atraumatic.  Sclerae nonicteric, noninjected.   Extraocular movements intact bilaterally. Neck:  Without mass or deformity.  Trachea is midline. Lungs:  Clear to auscultate bilaterally without rhonchi, wheeze, or rales. Heart:  Regular rate and rhythm.  Without murmurs, rubs, or gallops. Abdomen:  Soft, nontender, nondistended.  Without guarding or rebound. Extremities: Without cyanosis, clubbing, edema, or obvious deformity. Vascular:  Dorsalis pedis and posterior tibial pulses palpable bilaterally. Skin:  Warm and dry, no erythema, no ulcerations.  Data Reviewed: I have personally reviewed following labs and imaging studies  CBC: Recent Labs  Lab 10/19/19 1419 10/20/19 0109 10/21/19 0252 10/22/19 0235  WBC 10.9* 10.9* 8.9 10.6*  NEUTROABS 8.1*  --   --   --   HGB 13.5 12.5* 11.3* 10.9*  HCT 42.0 38.7* 35.9* 34.2*  MCV 84.5 85.2 86.7 86.8  PLT 252 287 238 264   Basic Metabolic Panel: Recent Labs  Lab 10/19/19 1419 10/20/19 0109  NA 137 139  K 3.6 3.5  CL 99 103  CO2 25 27  GLUCOSE 98 118*  BUN 7 7  CREATININE 0.73 0.87  CALCIUM 8.9 8.8*   GFR: Estimated Creatinine Clearance: 163.4 mL/min (by C-G formula based on SCr of 0.87 mg/dL). Liver Function Tests: Recent Labs  Lab 10/20/19 0109  AST 22  ALT 23  ALKPHOS 52  BILITOT 0.7  PROT 6.4*  ALBUMIN 3.0*   No results for input(s): LIPASE, AMYLASE in the last 168 hours. No results for input(s): AMMONIA in the last 168 hours. Coagulation Profile: Recent Labs  Lab 10/19/19 1419 10/21/19 0252 10/22/19 0235  INR 1.0 1.0 1.1   Cardiac Enzymes: No results for input(s): CKTOTAL, CKMB, CKMBINDEX, TROPONINI in the last 168 hours. BNP (last 3 results) No results for input(s): PROBNP in the last 8760 hours. HbA1C: No results for input(s): HGBA1C in the last 72 hours. CBG: No results for input(s): GLUCAP in the last 168 hours. Lipid Profile: No results for input(s): CHOL, HDL, LDLCALC, TRIG, CHOLHDL, LDLDIRECT in the last 72 hours. Thyroid Function Tests: No  results for input(s): TSH, T4TOTAL, FREET4, T3FREE, THYROIDAB in the last 72 hours. Anemia Panel: No results for input(s): VITAMINB12, FOLATE, FERRITIN, TIBC, IRON, RETICCTPCT in the last 72 hours. Sepsis Labs: Recent Labs  Lab 10/19/19 1614  LATICACIDVEN 1.8    Recent Results (from the past 240 hour(s))  Respiratory Panel by RT PCR (Flu A&B, Covid) - Nasopharyngeal Swab     Status: None   Collection Time: 10/19/19  2:37 PM   Specimen: Nasopharyngeal Swab  Result Value Ref Range Status   SARS Coronavirus 2 by RT PCR NEGATIVE NEGATIVE Final    Comment: (NOTE) SARS-CoV-2 target nucleic acids are NOT DETECTED.  The SARS-CoV-2 RNA is generally detectable in upper respiratoy specimens during the acute phase of infection. The lowest concentration of SARS-CoV-2 viral copies this assay can detect is 131 copies/mL. A negative result does not preclude SARS-Cov-2 infection and should not be used as the sole basis for treatment or other patient management decisions. A negative result may occur with  improper specimen collection/handling, submission of specimen other than nasopharyngeal swab, presence of viral mutation(s) within the areas  targeted by this assay, and inadequate number of viral copies (<131 copies/mL). A negative result must be combined with clinical observations, patient history, and epidemiological information. The expected result is Negative.  Fact Sheet for Patients:  https://www.moore.com/  Fact Sheet for Healthcare Providers:  https://www.young.biz/  This test is no t yet approved or cleared by the [place] FDA and  has been authorized for detection and/or diagnosis of SARS-CoV-2 by FDA under an Emergency Use Authorization (EUA). This EUA will remain  in effect (meaning this test can be used) for the duration of the COVID-19 declaration under Section 564(b)(1) of the Act, 21 U.S.C. section 360bbb-3(b)(1), unless the  authorization is terminated or revoked sooner.     Influenza A by PCR NEGATIVE NEGATIVE Final   Influenza B by PCR NEGATIVE NEGATIVE Final    Comment: (NOTE) The Xpert Xpress SARS-CoV-2/FLU/RSV assay is intended as an aid in  the diagnosis of influenza from Nasopharyngeal swab specimens and  should not be used as a sole basis for treatment. Nasal washings and  aspirates are unacceptable for Xpert Xpress SARS-CoV-2/FLU/RSV  testing.  Fact Sheet for Patients: https://www.moore.com/  Fact Sheet for Healthcare Providers: https://www.young.biz/  This test is not yet approved or cleared by the [place] FDA and  has been authorized for detection and/or diagnosis of SARS-CoV-2 by  FDA under an Emergency Use Authorization (EUA). This EUA will remain  in effect (meaning this test can be used) for the duration of the  Covid-19 declaration under Section 564(b)(1) of the Act, 21  U.S.C. section 360bbb-3(b)(1), unless the authorization is  terminated or revoked. Performed at [facility], [address] [place], [place] [address]   Blood culture (routine x 2)     Status: None (Preliminary result)   Collection Time: 10/19/19  4:57 PM   Specimen: Left Antecubital; Blood  Result Value Ref Range Status   Specimen Description   Final    LEFT ANTECUBITAL Performed at [facility], [address] [place], [place] [address]    Special Requests   Final    BOTTLES DRAWN AEROBIC AND ANAEROBIC Blood Culture adequate volume Performed at [facility], [address] [place], [place] [address]    Culture   Final    NO GROWTH 3 DAYS Performed at [facility], [address], [place], [place] [address]    Report Status PENDING  Incomplete  MRSA PCR Screening     Status: None   Collection Time: 10/19/19 10:58 PM   Specimen: Nasal Mucosa; Nasopharyngeal  Result Value Ref Range Status   MRSA by PCR NEGATIVE NEGATIVE  Final    Comment:        The GeneXpert MRSA Assay (FDA approved for NASAL specimens only), is one component of a comprehensive MRSA colonization surveillance program. It is not intended to diagnose MRSA infection nor to guide or monitor treatment for MRSA infections. Performed at [facility], [address], [place], [place] [address]      Radiology Studies: ECHOCARDIOGRAM COMPLETE  Result Date: 10/20/2019    ECHOCARDIOGRAM REPORT   Patient Name:   Andrew [name] Date of Exam: 10/20/2019 Medical Rec #:  [id number]               Height:       72.0 in Accession #:    [id number]              Weight:  251.8 lb Date of Birth: [birth year]                BSA:          2.350 m Patient Age:    29 years                BP:           128/68 mmHg Patient Gender: M                       HR:           81 bpm. Exam Location:  Inpatient Procedure: 2D Echo, 3D Echo and Strain Analysis Indications:    Pulmonary Embolus 415.19 / I26.99  History:        Patient has prior history of Echocardiogram examinations, most                 recent 08/22/2019. Risk Factors:Current Smoker. Polysubstance                 use, Multifocal pneumonia, History of rhabdomyolysis with                 residual nerve pain.  Sonographer:    Leta Jungling RDCS Referring Phys: [id number] Cecille Po MELVIN IMPRESSIONS  1. Left ventricular ejection fraction, by estimation, is 50 to 55%. The left ventricle has low normal function. The left ventricle has no regional wall motion abnormalities. Left ventricular diastolic parameters were normal.  2. Right ventricular systolic function is low normal. The right ventricular size is normal. Tricuspid regurgitation signal is inadequate for assessing PA pressure.  3. The mitral valve is normal in structure. No evidence of mitral valve regurgitation.  4. The aortic valve is normal in structure. Aortic valve regurgitation is not visualized. No aortic stenosis is present.  5. PV Acceleration  time mildly increase, suggestive of mild elevation in PAP.  6. The inferior vena cava is normal in size with greater than 50% respiratory variability, suggesting right atrial pressure of 3 mmHg. Comparison(s): Subtle decrease in LV and RV function from 8/11 study. FINDINGS  Left Ventricle: Left ventricular ejection fraction, by estimation, is 50 to 55%. The left ventricle has low normal function. The left ventricle has no regional wall motion abnormalities. The left ventricular internal cavity size was normal in size. There is no left ventricular hypertrophy. Left ventricular diastolic parameters were normal. Right Ventricle: The right ventricular size is normal. No increase in right ventricular wall thickness. Right ventricular systolic function is low normal. Tricuspid regurgitation signal is inadequate for assessing PA pressure. Left Atrium: Left atrial size was normal in size. Right Atrium: Right atrial size was normal in size. Pericardium: There is no evidence of pericardial effusion. Mitral Valve: The mitral valve is normal in structure. No evidence of mitral valve regurgitation. Tricuspid Valve: The tricuspid valve is normal in structure. Tricuspid valve regurgitation is trivial. Aortic Valve: The aortic valve is normal in structure. Aortic valve regurgitation is not visualized. No aortic stenosis is present. Pulmonic Valve: PV Acceleration time mildly increase, suggestive of mild elevation in PAP. The pulmonic valve was not well visualized. Pulmonic valve regurgitation is not visualized. Aorta: The aortic root and ascending aorta are structurally normal, with no evidence of dilitation. Venous: The inferior vena cava is normal in size with greater than 50% respiratory variability, suggesting right atrial pressure of 3 mmHg. IAS/Shunts: The atrial septum is grossly normal.  LEFT VENTRICLE PLAX 2D LVIDd:  5.10 cm  Diastology LVIDs:         3.60 cm  LV e' medial:    9.65 cm/s LV PW:         0.90 cm  LV  E/e' medial:  6.2 LV IVS:        1.00 cm  LV e' lateral:   12.70 cm/s LVOT diam:     2.00 cm  LV E/e' lateral: 4.7 LV SV:         48 LV SV Index:   20 LVOT Area:     3.14 cm                          3D Volume EF:                         3D EF:        52 % RIGHT VENTRICLE TAPSE (M-mode): 1.6 cm LEFT ATRIUM           Index LA diam:      3.50 cm 1.49 cm/m LA Vol (A2C): 36.1 ml 15.36 ml/m LA Vol (A4C): 33.8 ml 14.39 ml/m  AORTIC VALVE LVOT Vmax:   103.00 cm/s LVOT Vmean:  70.300 cm/s LVOT VTI:    0.153 m  AORTA Ao Root diam: 3.20 cm MITRAL VALVE MV Area (PHT): 3.10 cm    SHUNTS MV Decel Time: 245 msec    Systemic VTI:  0.15 m MV E velocity: 60.00 cm/s  Systemic Diam: 2.00 cm MV A velocity: 38.60 cm/s MV E/A ratio:  1.55 Riley Lam MD Electronically signed by Riley Lam MD Signature Date/Time: 10/20/2019/1:52:58 PM    Final    VAS [place] LOWER EXTREMITY VENOUS (DVT)  Result Date: 10/21/2019  Lower Venous DVTStudy Indications: Pulmonary embolism.  Comparison Study: No prior study Performing Technologist: Gertie Fey MHA, RDMS, RVT, RDCS  Examination Guidelines: A complete evaluation includes B-mode imaging, spectral Doppler, color Doppler, and power Doppler as needed of all accessible portions of each vessel. Bilateral testing is considered an integral part of a complete examination. Limited examinations for reoccurring indications may be performed as noted. The reflux portion of the exam is performed with the patient in reverse Trendelenburg.  +---------+---------------+---------+-----------+----------+--------------+  RIGHT     Compressibility Phasicity Spontaneity Properties Thrombus Aging  +---------+---------------+---------+-----------+----------+--------------+  CFV       Full            Yes       Yes                                    +---------+---------------+---------+-----------+----------+--------------+  SFJ       Full                                                              +---------+---------------+---------+-----------+----------+--------------+  FV Prox   Full                                                             +---------+---------------+---------+-----------+----------+--------------+  FV Mid    Full                                                             +---------+---------------+---------+-----------+----------+--------------+  FV Distal Full                                                             +---------+---------------+---------+-----------+----------+--------------+  PFV       Full                                                             +---------+---------------+---------+-----------+----------+--------------+  POP       Full            Yes       Yes                                    +---------+---------------+---------+-----------+----------+--------------+  PTV       Full                                                             +---------+---------------+---------+-----------+----------+--------------+  PERO      Full                                                             +---------+---------------+---------+-----------+----------+--------------+   +---------+---------------+---------+-----------+----------+--------------+  LEFT      Compressibility Phasicity Spontaneity Properties Thrombus Aging  +---------+---------------+---------+-----------+----------+--------------+  CFV       Full            Yes       Yes                                    +---------+---------------+---------+-----------+----------+--------------+  SFJ       Full                                                             +---------+---------------+---------+-----------+----------+--------------+  FV Prox   Full                                                             +---------+---------------+---------+-----------+----------+--------------+  FV Mid    Full                                                              +---------+---------------+---------+-----------+----------+--------------+  FV Distal Full                                                             +---------+---------------+---------+-----------+----------+--------------+  PFV       Full                                                             +---------+---------------+---------+-----------+----------+--------------+  POP       Full            Yes       Yes                                    +---------+---------------+---------+-----------+----------+--------------+  PTV       Full                                                             +---------+---------------+---------+-----------+----------+--------------+  PERO      Full                                                             +---------+---------------+---------+-----------+----------+--------------+     Summary: RIGHT: - There is no evidence of deep vein thrombosis in the lower extremity.  - No cystic structure found in the popliteal fossa.  LEFT: - There is no evidence of deep vein thrombosis in the lower extremity.  - No cystic structure found in the popliteal fossa.  *See table(s) above for measurements and observations. Electronically signed by Lemar Livings MD on 10/21/2019 at 11:32:52 AM.    Final         Scheduled Meds:  folic acid  1 mg Oral Daily   gabapentin  100 mg Oral TID   mirtazapine  15 mg Oral QHS   multivitamin with minerals  1 tablet Oral Daily   nicotine  21 mg Transdermal Q2200   sodium chloride flush  3 mL Intravenous Q12H   thiamine  100 mg Oral Daily   Warfarin - Pharmacist Dosing Inpatient   Does not apply q1600   Continuous Infusions:  sodium chloride 10 mL/hr at 10/19/19 1713   azithromycin 500 mg (10/21/19 1718)   cefTRIAXone (ROCEPHIN)  IV 2 g (10/21/19 1619)   heparin 2,200 Units/hr (  10/21/19 2208)     LOS: 3 days   Time spent: 35 min  Azucena Fallen, DO [facility]  If 7PM-7AM, please contact  night-coverage [email or web address]  10/22/2019, 7:44 AM

## 2019-10-23 LAB — PROTIME-INR
INR: 1.3 — ABNORMAL HIGH (ref 0.8–1.2)
Prothrombin Time: 16 seconds — ABNORMAL HIGH (ref 11.4–15.2)

## 2019-10-23 LAB — CBC
HCT: 35.2 % — ABNORMAL LOW (ref 39.0–52.0)
Hemoglobin: 10.9 g/dL — ABNORMAL LOW (ref 13.0–17.0)
MCH: 26.8 pg (ref 26.0–34.0)
MCHC: 31 g/dL (ref 30.0–36.0)
MCV: 86.5 fL (ref 80.0–100.0)
Platelets: 288 10*3/uL (ref 150–400)
RBC: 4.07 MIL/uL — ABNORMAL LOW (ref 4.22–5.81)
RDW: 12.9 % (ref 11.5–15.5)
WBC: 8.8 10*3/uL (ref 4.0–10.5)
nRBC: 0 % (ref 0.0–0.2)

## 2019-10-23 LAB — HEPARIN LEVEL (UNFRACTIONATED): Heparin Unfractionated: 0.52 IU/mL (ref 0.30–0.70)

## 2019-10-23 LAB — LEGIONELLA PNEUMOPHILA SEROGP 1 UR AG: L. pneumophila Serogp 1 Ur Ag: NEGATIVE

## 2019-10-23 MED ORDER — WARFARIN SODIUM 5 MG PO TABS
10.0000 mg | ORAL_TABLET | Freq: Once | ORAL | Status: AC
  Administered 2019-10-23: 10 mg via ORAL
  Filled 2019-10-23: qty 2

## 2019-10-23 NOTE — Discharge Instructions (Signed)

## 2019-10-23 NOTE — Progress Notes (Signed)
PROGRESS NOTE    Andrew [name]  PFX:[id number] DOB: 1990/09/03 DOA: 10/19/2019 PCP: Patient, No Pcp Per   Brief Narrative:  Andrew [name] is a 29 y.o. male with medical history significant of depression, schizophrenia, back pain, polysubstance use (heroin, marijuana, tobacco, alcohol), and hospitalization from August 10 to August 15 for rhabdo my lysis and compartment syndrome (did not require surgical intervention) who presented with 3 days of productive cough and worsening dyspnea. As above, symptoms were present for about 3 days. He states he has had associated intermittent trace bloody sputum if he has a deep cough and he experiences pleuritic chest pain whenever he inhales deeply. He also endorses a decreased appetite. He denies nausea, vomiting, fever, abdominal pain, constipation, diarrhea. In ED: D-dimer 2.99; CTA PE study showed bilateral PE involving segmental branches of the lower lobes with slight right ventricular enlargement which may represent a degree of strain; CTA also redemonstrated multifocal pneumonia possibly atypical in etiology.   Assessment & Plan:   Principal Problem:   Bilateral pulmonary embolism (HCC) Active Problems:   Polysubstance abuse (HCC)   History of rhabdomyolysis   Multifocal pneumonia  Acute hypoxic respiratory failure in the setting of bilateral pulmonary embolism:  - CTA positive for bilateral filling defects  - Currently on heparin drip, transition to Coumadin -follow INR - Could consider home Lovenox dosing will transition and early discharge however given patient has no PCP or safe follow-up disposition location I am uncomfortable with this option and would rather keep patient in the hospital until INR is therapeutic. - Echocardiogram unremarkable; DVT study negative bilaterally. Lab Results  Component Value Date   INR 1.3 (H) 10/23/2019   INR 1.1 10/22/2019   INR 1.0 10/21/2019    Questionable multifocal pneumonia, POA:    - Patient does not meet sepsis criteria, mild leukocytosis initially but without fever tachycardia or tachypnea - Continue ceftriaxone and azithromycin - to complete today 10/23/19  Polysubstance use: - Reports snorting heroin 3 days prior to admission-  denies injecting any medications - Also admits to marijuana alcohol and tobacco use/abuse - Continue CIWA protocol - Nicotine patch  History of schizophrenia - Continue mirtazapine  History of rhabdomyolysis with residual nerve pain - Continue gabapentin  DVT prophylaxis:  Heparin gtt --> coumadin transitioning Code Status:   Full Family Communication: None present  Status is: Inpatient  Dispo: The patient is from: Home              Anticipated d/c is to: Home              Anticipated d/c date is: 24 to 48 hours pending INR              Patient currently not medically stable for discharge given ongoing need for IV heparin in the setting of bilateral PE and transition to p.o. anticoagulation prior to discharge safely.  Consultants:   None  Procedures:   None  Antimicrobials:  Azithromycin, ceftriaxone through 10/23/2019  Subjective: No acute issues or events overnight, patient denies chest pain, shortness of breath, nausea, vomiting, diarrhea, constipation, headache, fevers, chills.  Objective: Vitals:   10/22/19 1913 10/22/19 2320 10/23/19 0323 10/23/19 0802  BP: (!) 142/80 140/87 130/65 134/82  Pulse: 91 90 93 90  Resp: 18 19 18 19   Temp: 99.3 F (37.4 C) 100.1 F (37.8 C) 100 F (37.8 C) 98.7 F (37.1 C)  TempSrc: Oral Oral Oral   SpO2: 100% 99% 98% 100%  Weight:  Height:        Intake/Output Summary (Last 24 hours) at 10/23/2019 1414 Last data filed at 10/22/2019 1808 Gross per 24 hour  Intake 783.34 ml  Output --  Net 783.34 ml   Filed Weights   10/19/19 1311 10/19/19 2053  Weight: 112.5 kg 114.2 kg    Examination:  General:  Pleasantly resting in bed, No acute distress. HEENT:   Normocephalic atraumatic.  Sclerae nonicteric, noninjected.  Extraocular movements intact bilaterally. Neck:  Without mass or deformity.  Trachea is midline. Lungs:  Clear to auscultate bilaterally without rhonchi, wheeze, or rales. Heart:  Regular rate and rhythm.  Without murmurs, rubs, or gallops. Abdomen:  Soft, nontender, nondistended.  Without guarding or rebound. Extremities: Without cyanosis, clubbing, edema, or obvious deformity. Vascular:  Dorsalis pedis and posterior tibial pulses palpable bilaterally. Skin:  Warm and dry, no erythema, no ulcerations.  Data Reviewed: I have personally reviewed following labs and imaging studies  CBC: Recent Labs  Lab 10/19/19 1419 10/20/19 0109 10/21/19 0252 10/22/19 0235 10/23/19 0101  WBC 10.9* 10.9* 8.9 10.6* 8.8  NEUTROABS 8.1*  --   --   --   --   HGB 13.5 12.5* 11.3* 10.9* 10.9*  HCT 42.0 38.7* 35.9* 34.2* 35.2*  MCV 84.5 85.2 86.7 86.8 86.5  PLT 252 287 238 264 288   Basic Metabolic Panel: Recent Labs  Lab 10/19/19 1419 10/20/19 0109  NA 137 139  K 3.6 3.5  CL 99 103  CO2 25 27  GLUCOSE 98 118*  BUN 7 7  CREATININE 0.73 0.87  CALCIUM 8.9 8.8*   GFR: Estimated Creatinine Clearance: 163.4 mL/min (by C-G formula based on SCr of 0.87 mg/dL). Liver Function Tests: Recent Labs  Lab 10/20/19 0109  AST 22  ALT 23  ALKPHOS 52  BILITOT 0.7  PROT 6.4*  ALBUMIN 3.0*   No results for input(s): LIPASE, AMYLASE in the last 168 hours. No results for input(s): AMMONIA in the last 168 hours. Coagulation Profile: Recent Labs  Lab 10/19/19 1419 10/21/19 0252 10/22/19 0235 10/23/19 0101  INR 1.0 1.0 1.1 1.3*   Cardiac Enzymes: No results for input(s): CKTOTAL, CKMB, CKMBINDEX, TROPONINI in the last 168 hours. BNP (last 3 results) No results for input(s): PROBNP in the last 8760 hours. HbA1C: No results for input(s): HGBA1C in the last 72 hours. CBG: No results for input(s): GLUCAP in the last 168 hours. Lipid  Profile: No results for input(s): CHOL, HDL, LDLCALC, TRIG, CHOLHDL, LDLDIRECT in the last 72 hours. Thyroid Function Tests: No results for input(s): TSH, T4TOTAL, FREET4, T3FREE, THYROIDAB in the last 72 hours. Anemia Panel: No results for input(s): VITAMINB12, FOLATE, FERRITIN, TIBC, IRON, RETICCTPCT in the last 72 hours. Sepsis Labs: Recent Labs  Lab 10/19/19 1614  LATICACIDVEN 1.8    Recent Results (from the past 240 hour(s))  Respiratory Panel by RT PCR (Flu A&B, Covid) - Nasopharyngeal Swab     Status: None   Collection Time: 10/19/19  2:37 PM   Specimen: Nasopharyngeal Swab  Result Value Ref Range Status   SARS Coronavirus 2 by RT PCR NEGATIVE NEGATIVE Final    Comment: (NOTE) SARS-CoV-2 target nucleic acids are NOT DETECTED.  The SARS-CoV-2 RNA is generally detectable in upper respiratoy specimens during the acute phase of infection. The lowest concentration of SARS-CoV-2 viral copies this assay can detect is 131 copies/mL. A negative result does not preclude SARS-Cov-2 infection and should not be used as the sole basis for treatment or other patient  management decisions. A negative result may occur with  improper specimen collection/handling, submission of specimen other than nasopharyngeal swab, presence of viral mutation(s) within the areas targeted by this assay, and inadequate number of viral copies (<131 copies/mL). A negative result must be combined with clinical observations, patient history, and epidemiological information. The expected result is Negative.  Fact Sheet for Patients:  https://www.moore.com/  Fact Sheet for Healthcare Providers:  https://www.young.biz/  This test is no t yet approved or cleared by the [place] FDA and  has been authorized for detection and/or diagnosis of SARS-CoV-2 by FDA under an Emergency Use Authorization (EUA). This EUA will remain  in effect (meaning this test can be used) for  the duration of the COVID-19 declaration under Section 564(b)(1) of the Act, 21 U.S.C. section 360bbb-3(b)(1), unless the authorization is terminated or revoked sooner.     Influenza A by PCR NEGATIVE NEGATIVE Final   Influenza B by PCR NEGATIVE NEGATIVE Final    Comment: (NOTE) The Xpert Xpress SARS-CoV-2/FLU/RSV assay is intended as an aid in  the diagnosis of influenza from Nasopharyngeal swab specimens and  should not be used as a sole basis for treatment. Nasal washings and  aspirates are unacceptable for Xpert Xpress SARS-CoV-2/FLU/RSV  testing.  Fact Sheet for Patients: https://www.moore.com/  Fact Sheet for Healthcare Providers: https://www.young.biz/  This test is not yet approved or cleared by the [place] FDA and  has been authorized for detection and/or diagnosis of SARS-CoV-2 by  FDA under an Emergency Use Authorization (EUA). This EUA will remain  in effect (meaning this test can be used) for the duration of the  Covid-19 declaration under Section 564(b)(1) of the Act, 21  U.S.C. section 360bbb-3(b)(1), unless the authorization is  terminated or revoked. Performed at [facility], [address] [place], [place] [address]   Blood culture (routine x 2)     Status: None (Preliminary result)   Collection Time: 10/19/19  4:57 PM   Specimen: Left Antecubital; Blood  Result Value Ref Range Status   Specimen Description   Final    LEFT ANTECUBITAL Performed at [facility], [address] [place], [place] [address]    Special Requests   Final    BOTTLES DRAWN AEROBIC AND ANAEROBIC Blood Culture adequate volume Performed at [facility], [address] [place], [place] [address]    Culture   Final    NO GROWTH 4 DAYS Performed at [facility], [address], [place], [place] [address]    Report Status PENDING  Incomplete  MRSA PCR Screening     Status: None   Collection Time:  10/19/19 10:58 PM   Specimen: Nasal Mucosa; Nasopharyngeal  Result Value Ref Range Status   MRSA by PCR NEGATIVE NEGATIVE Final    Comment:        The GeneXpert MRSA Assay (FDA approved for NASAL specimens only), is one component of a comprehensive MRSA colonization surveillance program. It is not intended to diagnose MRSA infection nor to guide or monitor treatment for MRSA infections. Performed at [facility], [address] [place], [place] [address]      Radiology Studies: No results found.      Scheduled Meds: . folic acid  1 mg Oral Daily  . gabapentin  100 mg Oral TID  . mirtazapine  15 mg Oral QHS  . multivitamin with minerals  1 tablet Oral Daily  . nicotine  21 mg Transdermal Q2200  . sodium chloride flush  3 mL Intravenous Q12H  . thiamine  100 mg Oral Daily  . warfarin  10 mg Oral ONCE-1600  . Warfarin - Pharmacist Dosing Inpatient   Does not apply q1600   Continuous Infusions: . sodium chloride 10 mL/hr at 10/19/19 1713  . azithromycin Stopped (10/22/19 1900)  . cefTRIAXone (ROCEPHIN)  IV Stopped (10/22/19 2200)  . heparin 2,200 Units/hr (10/23/19 1005)     LOS: 4 days   Time spent: 35 min  Azucena Fallen, DO [facility]  If 7PM-7AM, please contact night-coverage [email or web address]  10/23/2019, 2:14 PM

## 2019-10-23 NOTE — Progress Notes (Signed)
Patient ask me to check to see if he could shower, I told him I did not think so but checked with the charge. Advised patient that a shower with heparin drip and tele monitor was not advised per charge nurse.  I went to lunch, Telemetry called and reported patient off tele, nurse covering for me went into room to check patient who was getting in the shower. Nurse covering his room for me reported smelling smoke in patients room.  I went in and also smelled smoke, reported it to charge and manger. Manger advised I should tell patient he could not smoke or security would be called to search his room and remove cigarettes. Mom was in patient room and was very insulted that I would think he might smoke. Stated she would report me. I told her the nurse manger was in the hall if she'd like to speak with her but she said that was not who she was reporting to. I reapplied tele monitor and asked if anything else was needed. Patient's Mom was curt in the rest of our interactions though I tried to be conciliatory.

## 2019-10-23 NOTE — Progress Notes (Signed)
ANTICOAGULATION CONSULT NOTE  Pharmacy Consult:  Heparin / Coumadin Indication: pulmonary embolus  Allergies  Allergen Reactions   Haloperidol Lactate Other (See Comments)   Penicillins Hives   Patient Measurements: Height: 6' (182.9 cm) Weight: 114.2 kg (251 lb 12.3 oz) IBW/kg (Calculated) : 77.6 Heparin Dosing Weight: 102.2kg  Vital Signs: Temp: 98.7 F (37.1 C) (10/12 0802) Temp Source: Oral (10/12 0323) BP: 134/82 (10/12 0802) Pulse Rate: 90 (10/12 0802)  Labs: Recent Labs    10/21/19 0252 10/21/19 0252 10/21/19 1527 10/22/19 0235 10/23/19 0101  HGB 11.3*   < >  --  10.9* 10.9*  HCT 35.9*  --   --  34.2* 35.2*  PLT 238  --   --  264 288  LABPROT 12.9  --   --  13.7 16.0*  INR 1.0  --   --  1.1 1.3*  HEPARINUNFRC 0.31   < > 0.56 0.53 0.52   < > = values in this interval not displayed.    Estimated Creatinine Clearance: 163.4 mL/min (by C-G formula based on SCr of 0.87 mg/dL).   Assessment: 29 year old male presenting with cough and pleuritic chest pain found to have bilateral PE with evidence of mild R heart strain.  Pharmacy also consulted to bridge IV heparin to Coumadin.  Heparin level is therapeutic and INR is starting to trend up; no bleeding reported.  Goal of Therapy:  INR 2-3 Heparin level 0.3-0.7 units/ml Monitor platelets by anticoagulation protocol: Yes   Plan:  Continue heparin gtt at 2200 units/hr Repeat Coumadin 10mg  PO today Daily heparin level, PT/INR and CBC  Tondra Reierson D. , PharmD, BCPS, BCCCP 10/23/2019, 9:15 AM

## 2019-10-24 DIAGNOSIS — F1910 Other psychoactive substance abuse, uncomplicated: Secondary | ICD-10-CM

## 2019-10-24 DIAGNOSIS — Z8739 Personal history of other diseases of the musculoskeletal system and connective tissue: Secondary | ICD-10-CM

## 2019-10-24 LAB — CBC
HCT: 35 % — ABNORMAL LOW (ref 39.0–52.0)
Hemoglobin: 10.9 g/dL — ABNORMAL LOW (ref 13.0–17.0)
MCH: 26.7 pg (ref 26.0–34.0)
MCHC: 31.1 g/dL (ref 30.0–36.0)
MCV: 85.6 fL (ref 80.0–100.0)
Platelets: 353 10*3/uL (ref 150–400)
RBC: 4.09 MIL/uL — ABNORMAL LOW (ref 4.22–5.81)
RDW: 13.1 % (ref 11.5–15.5)
WBC: 9.3 10*3/uL (ref 4.0–10.5)
nRBC: 0 % (ref 0.0–0.2)

## 2019-10-24 LAB — CULTURE, BLOOD (ROUTINE X 2)
Culture: NO GROWTH
Special Requests: ADEQUATE

## 2019-10-24 LAB — PROTIME-INR
INR: 1.4 — ABNORMAL HIGH (ref 0.8–1.2)
Prothrombin Time: 16.5 seconds — ABNORMAL HIGH (ref 11.4–15.2)

## 2019-10-24 LAB — HEPARIN LEVEL (UNFRACTIONATED): Heparin Unfractionated: 0.43 IU/mL (ref 0.30–0.70)

## 2019-10-24 MED ORDER — WARFARIN SODIUM 7.5 MG PO TABS
12.5000 mg | ORAL_TABLET | Freq: Once | ORAL | Status: AC
  Administered 2019-10-24: 12.5 mg via ORAL
  Filled 2019-10-24: qty 1

## 2019-10-24 NOTE — Progress Notes (Signed)
ANTICOAGULATION CONSULT NOTE  Pharmacy Consult:  Heparin / Coumadin Indication: pulmonary embolus  Allergies  Allergen Reactions  . Haloperidol Lactate Other (See Comments)  . Penicillins Hives   Patient Measurements: Height: 6' (182.9 cm) Weight: 114.2 kg (251 lb 12.3 oz) IBW/kg (Calculated) : 77.6 Heparin Dosing Weight: 102.2kg  Vital Signs: Temp: 99.2 F (37.3 C) (10/13 0820) Temp Source: Oral (10/13 0236) BP: 135/73 (10/13 0820) Pulse Rate: 82 (10/13 0820)  Labs: Recent Labs    10/22/19 0235 10/22/19 0235 10/23/19 0101 10/24/19 0421  HGB 10.9*   < > 10.9* 10.9*  HCT 34.2*  --  35.2* 35.0*  PLT 264  --  288 353  LABPROT 13.7  --  16.0* 16.5*  INR 1.1  --  1.3* 1.4*  HEPARINUNFRC 0.53  --  0.52 0.43   < > = values in this interval not displayed.    Estimated Creatinine Clearance: 163.4 mL/min (by C-G formula based on SCr of 0.87 mg/dL).   Assessment: 29 year old male presenting with cough and pleuritic chest pain found to have bilateral PE with evidence of mild R heart strain.  Pharmacy also consulted to bridge IV heparin to Coumadin.  Heparin level remains therapeutic at 0.43 this AM on rate of 2200 units/hr.  INR is slowly trending up; no bleeding reported. H/H is low stable for last 3 days. Platelets elevated  Goal of Therapy:  INR 2-3 Heparin level 0.3-0.7 units/ml Monitor platelets by anticoagulation protocol: Yes   Plan:  Continue heparin gtt at 2200 units/hr Increase to Coumadin 12.5mg  PO today Daily heparin level, PT/INR and CBC  Link Snuffer, PharmD, BCPS, BCCCP Clinical Pharmacist Please refer to [facility] for [facility] Pharmacy numbers 10/24/2019, 12:07 PM

## 2019-10-24 NOTE — Progress Notes (Signed)
Transitions of Care Pharmacist Note  Andrew [name] is a 29 y.o. male that has been diagnosed with PE and will be prescribed Coumadin (warfarin)  at discharge.   Patient Education: I provided the following education on 10/13 to the patient: How to take the medication Described what the medication is Signs of bleeding Signs/symptoms of VTE and stroke  Answered their questions Dietary and medication precautions with warfarin   Discharge Medications Plan: The patient wants to have their discharge medications filled by the Transitions of Care pharmacy rather than their usual pharmacy.  The discharge orders pharmacy has been changed to the Transitions of Care pharmacy, the patient will receive a phone call regarding co-pay, and their medications will be delivered by the Transitions of Care pharmacy.    Thank you,   Yvetta Coder, PharmD PGY1 Acute Care Pharmacy Resident  October 24, 2019

## 2019-10-24 NOTE — TOC Progression Note (Addendum)
Transition of Care [facility]) - Progression Note    Patient Details  Name: Andrew [name] MRN: [id number] Date of Birth: [birth year]  Transition of [facility]) CM/SW Contact  Lorri Frederick, LCSW Phone Number: 10/24/2019, 9:22 AM  Clinical Narrative: CSW spoke with pt regarding PCP.  Pt stays in [facility], would like PCP there.  Permission given to speak with mother Andrew [name] at [phone].  Pt also asked about help with medicaid. (CSW emailed financial counseling)  Pt is vaccinated.    CSW spoke to [facility] and scheduled eligibility appt for 10/25, 9am.  No medical appt can be scheduled until eligibility is established.      Expected Discharge Plan: Home/Self Care Barriers to Discharge: Continued Medical Work up  Expected Discharge Plan and Services Expected Discharge Plan: Home/Self Care   Discharge Planning Services: CM Consult                                           Social Determinants of Health (SDOH) Interventions    Readmission Risk Interventions No flowsheet data found.

## 2019-10-24 NOTE — Progress Notes (Signed)
PROGRESS NOTE    Andrew [name]  AST:[id number] DOB: 07-31-1990 DOA: 10/19/2019 PCP: Patient, No Pcp Per   Brief Narrative:  Andrew [name] is a 29 y.o. male with medical history significant of depression, schizophrenia, back pain, polysubstance use (heroin, marijuana, tobacco, alcohol), and hospitalization from August 10 to August 15 for rhabdo my lysis and compartment syndrome (did not require surgical intervention) who presented with 3 days of productive cough and worsening dyspnea. As above, symptoms were present for about 3 days. He states he has had associated intermittent trace bloody sputum if he has a deep cough and he experiences pleuritic chest pain whenever he inhales deeply. He also endorses a decreased appetite. He denies nausea, vomiting, fever, abdominal pain, constipation, diarrhea. In ED: D-dimer 2.99; CTA PE study showed bilateral PE involving segmental branches of the lower lobes with slight right ventricular enlargement which may represent a degree of strain; CTA also redemonstrated multifocal pneumonia possibly atypical in etiology.   Assessment & Plan:   Principal Problem:   Bilateral pulmonary embolism (HCC) Active Problems:   Polysubstance abuse (HCC)   History of rhabdomyolysis   Multifocal pneumonia  Acute hypoxic respiratory failure in the setting of bilateral pulmonary embolism:  CTA positive for bilateral filling defects  Currently on heparin drip, Coumadin -follow INR Pt has no PCP or safe follow-up disposition, pt will need to be in hospital till INR is therapeutic. Echocardiogram unremarkable; DVT study negative bilaterally. Lab Results  Component Value Date   INR 1.4 (H) 10/24/2019   INR 1.3 (H) 10/23/2019   INR 1.1 10/22/2019    Questionable multifocal pneumonia, POA:  Patient does not meet sepsis criteria, mild leukocytosis initially but without fever tachycardia or tachypnea Continue ceftriaxone and azithromycin - to complete  10/24/19  Polysubstance use: Reports snorting heroin 3 days prior to admission-  denies injecting any medications Also admits to marijuana alcohol and tobacco use/abuse Continue CIWA protocol Nicotine patch  History of schizophrenia Continue mirtazapine  History of rhabdomyolysis with residual nerve pain Continue gabapentin     DVT prophylaxis:  Heparin gtt --> coumadin transitioning Code Status:   Full Family Communication: None present  Status is: Inpatient  Dispo: The patient is from: Home              Anticipated d/c is to: Home              Anticipated d/c date is: 24 to 48 hours pending INR              Patient currently not medically stable for discharge given ongoing need for IV heparin in the setting of bilateral PE and transition to p.o. anticoagulation prior to discharge safely.  Consultants:   None  Procedures:   None  Antimicrobials:  Azithromycin, ceftriaxone through 10/24/2019  Subjective: Denies any new complaints. Frustrated about INR levels and coumadin dosing    Objective: Vitals:   10/24/19 0507 10/24/19 0820 10/24/19 1204 10/24/19 1634  BP:  135/73 118/72 (!) 118/47  Pulse:  82 79 81  Resp:  17 17 17   Temp:  99.2 F (37.3 C) 98.8 F (37.1 C) 99 F (37.2 C)  TempSrc:      SpO2: 100% 98% 99% 98%  Weight:      Height:        Intake/Output Summary (Last 24 hours) at 10/24/2019 1730 Last data filed at 10/24/2019 1534 Gross per 24 hour  Intake 916.74 ml  Output --  Net 916.74 ml  Filed Weights   10/19/19 1311 10/19/19 2053  Weight: 112.5 kg 114.2 kg    Examination:  General: NAD   Cardiovascular: S1, S2 present  Respiratory: CTAB  Abdomen: Soft, nontender, nondistended, bowel sounds present  Musculoskeletal: No bilateral pedal edema noted, Noted RLE ankle monitor  Skin: Normal  Psychiatry: Normal mood   Data Reviewed: I have personally reviewed following labs and imaging studies  CBC: Recent Labs  Lab  10/19/19 1419 10/19/19 1419 10/20/19 0109 10/21/19 0252 10/22/19 0235 10/23/19 0101 10/24/19 0421  WBC 10.9*   < > 10.9* 8.9 10.6* 8.8 9.3  NEUTROABS 8.1*  --   --   --   --   --   --   HGB 13.5   < > 12.5* 11.3* 10.9* 10.9* 10.9*  HCT 42.0   < > 38.7* 35.9* 34.2* 35.2* 35.0*  MCV 84.5   < > 85.2 86.7 86.8 86.5 85.6  PLT 252   < > 287 238 264 288 353   < > = values in this interval not displayed.   Basic Metabolic Panel: Recent Labs  Lab 10/19/19 1419 10/20/19 0109  NA 137 139  K 3.6 3.5  CL 99 103  CO2 25 27  GLUCOSE 98 118*  BUN 7 7  CREATININE 0.73 0.87  CALCIUM 8.9 8.8*   GFR: Estimated Creatinine Clearance: 163.4 mL/min (by C-G formula based on SCr of 0.87 mg/dL). Liver Function Tests: Recent Labs  Lab 10/20/19 0109  AST 22  ALT 23  ALKPHOS 52  BILITOT 0.7  PROT 6.4*  ALBUMIN 3.0*   No results for input(s): LIPASE, AMYLASE in the last 168 hours. No results for input(s): AMMONIA in the last 168 hours. Coagulation Profile: Recent Labs  Lab 10/19/19 1419 10/21/19 0252 10/22/19 0235 10/23/19 0101 10/24/19 0421  INR 1.0 1.0 1.1 1.3* 1.4*   Cardiac Enzymes: No results for input(s): CKTOTAL, CKMB, CKMBINDEX, TROPONINI in the last 168 hours. BNP (last 3 results) No results for input(s): PROBNP in the last 8760 hours. HbA1C: No results for input(s): HGBA1C in the last 72 hours. CBG: No results for input(s): GLUCAP in the last 168 hours. Lipid Profile: No results for input(s): CHOL, HDL, LDLCALC, TRIG, CHOLHDL, LDLDIRECT in the last 72 hours. Thyroid Function Tests: No results for input(s): TSH, T4TOTAL, FREET4, T3FREE, THYROIDAB in the last 72 hours. Anemia Panel: No results for input(s): VITAMINB12, FOLATE, FERRITIN, TIBC, IRON, RETICCTPCT in the last 72 hours. Sepsis Labs: Recent Labs  Lab 10/19/19 1614  LATICACIDVEN 1.8    Recent Results (from the past 240 hour(s))  Respiratory Panel by RT PCR (Flu A&B, Covid) - Nasopharyngeal Swab      Status: None   Collection Time: 10/19/19  2:37 PM   Specimen: Nasopharyngeal Swab  Result Value Ref Range Status   SARS Coronavirus 2 by RT PCR NEGATIVE NEGATIVE Final    Comment: (NOTE) SARS-CoV-2 target nucleic acids are NOT DETECTED.  The SARS-CoV-2 RNA is generally detectable in upper respiratoy specimens during the acute phase of infection. The lowest concentration of SARS-CoV-2 viral copies this assay can detect is 131 copies/mL. A negative result does not preclude SARS-Cov-2 infection and should not be used as the sole basis for treatment or other patient management decisions. A negative result may occur with  improper specimen collection/handling, submission of specimen other than nasopharyngeal swab, presence of viral mutation(s) within the areas targeted by this assay, and inadequate number of viral copies (<131 copies/mL). A negative result must be combined  with clinical observations, patient history, and epidemiological information. The expected result is Negative.  Fact Sheet for Patients:  https://www.moore.com/  Fact Sheet for Healthcare Providers:  https://www.young.biz/  This test is no t yet approved or cleared by the [place] FDA and  has been authorized for detection and/or diagnosis of SARS-CoV-2 by FDA under an Emergency Use Authorization (EUA). This EUA will remain  in effect (meaning this test can be used) for the duration of the COVID-19 declaration under Section 564(b)(1) of the Act, 21 U.S.C. section 360bbb-3(b)(1), unless the authorization is terminated or revoked sooner.     Influenza A by PCR NEGATIVE NEGATIVE Final   Influenza B by PCR NEGATIVE NEGATIVE Final    Comment: (NOTE) The Xpert Xpress SARS-CoV-2/FLU/RSV assay is intended as an aid in  the diagnosis of influenza from Nasopharyngeal swab specimens and  should not be used as a sole basis for treatment. Nasal washings and  aspirates are  unacceptable for Xpert Xpress SARS-CoV-2/FLU/RSV  testing.  Fact Sheet for Patients: https://www.moore.com/  Fact Sheet for Healthcare Providers: https://www.young.biz/  This test is not yet approved or cleared by the [place] FDA and  has been authorized for detection and/or diagnosis of SARS-CoV-2 by  FDA under an Emergency Use Authorization (EUA). This EUA will remain  in effect (meaning this test can be used) for the duration of the  Covid-19 declaration under Section 564(b)(1) of the Act, 21  U.S.C. section 360bbb-3(b)(1), unless the authorization is  terminated or revoked. Performed at [facility], [address] [place], [place] [address]   Blood culture (routine x 2)     Status: None   Collection Time: 10/19/19  4:57 PM   Specimen: Left Antecubital; Blood  Result Value Ref Range Status   Specimen Description   Final    LEFT ANTECUBITAL Performed at [facility], [address] [place], [place] [address]    Special Requests   Final    BOTTLES DRAWN AEROBIC AND ANAEROBIC Blood Culture adequate volume Performed at [facility], [address] [place], [place] [address]    Culture   Final    NO GROWTH 5 DAYS Performed at [facility], [address] [place], [place] [address]    Report Status 10/24/2019 FINAL  Final  MRSA PCR Screening     Status: None   Collection Time: 10/19/19 10:58 PM   Specimen: Nasal Mucosa; Nasopharyngeal  Result Value Ref Range Status   MRSA by PCR NEGATIVE NEGATIVE Final    Comment:        The GeneXpert MRSA Assay (FDA approved for NASAL specimens only), is one component of a comprehensive MRSA colonization surveillance program. It is not intended to diagnose MRSA infection nor to guide or monitor treatment for MRSA infections. Performed at [facility], [address] [place], [place] [address]      Radiology Studies: No results  found.      Scheduled Meds: . folic acid  1 mg Oral Daily  . gabapentin  100 mg Oral TID  . mirtazapine  15 mg Oral QHS  . multivitamin with minerals  1 tablet Oral Daily  . nicotine  21 mg Transdermal Q2200  . sodium chloride flush  3 mL Intravenous Q12H  . thiamine  100 mg Oral Daily  . Warfarin - Pharmacist Dosing Inpatient   Does not apply q1600   Continuous Infusions: . sodium chloride 10 mL/hr at  10/19/19 1713  . azithromycin 500 mg (10/24/19 1648)  . heparin 2,200 Units/hr (10/24/19 1140)     LOS: 5 days     Briant Cedar, MD [facility]  If 7PM-7AM, please contact night-coverage [email or web address]  10/24/2019, 5:30 PM

## 2019-10-25 LAB — CBC
HCT: 32.7 % — ABNORMAL LOW (ref 39.0–52.0)
Hemoglobin: 10.3 g/dL — ABNORMAL LOW (ref 13.0–17.0)
MCH: 27.1 pg (ref 26.0–34.0)
MCHC: 31.5 g/dL (ref 30.0–36.0)
MCV: 86.1 fL (ref 80.0–100.0)
Platelets: 374 10*3/uL (ref 150–400)
RBC: 3.8 MIL/uL — ABNORMAL LOW (ref 4.22–5.81)
RDW: 13 % (ref 11.5–15.5)
WBC: 7.7 10*3/uL (ref 4.0–10.5)
nRBC: 0 % (ref 0.0–0.2)

## 2019-10-25 LAB — PROTIME-INR
INR: 1.5 — ABNORMAL HIGH (ref 0.8–1.2)
Prothrombin Time: 17.4 seconds — ABNORMAL HIGH (ref 11.4–15.2)

## 2019-10-25 LAB — HEPARIN LEVEL (UNFRACTIONATED): Heparin Unfractionated: 0.47 IU/mL (ref 0.30–0.70)

## 2019-10-25 MED ORDER — IBUPROFEN 200 MG PO TABS: 400.0000 mg | ORAL_TABLET | Freq: Four times a day (QID) | ORAL | Status: DC | PRN

## 2019-10-25 MED ORDER — WARFARIN SODIUM 7.5 MG PO TABS
15.0000 mg | ORAL_TABLET | Freq: Once | ORAL | Status: AC
  Administered 2019-10-25: 15 mg via ORAL
  Filled 2019-10-25: qty 2

## 2019-10-25 NOTE — Progress Notes (Signed)
PROGRESS NOTE    Baylor Cortez  LGX:[id number] DOB: 1990/03/06 DOA: 10/19/2019 PCP: Patient, No Pcp Per   Brief Narrative:  Grady Mohabir is a 29 y.o. male with medical history significant of depression, schizophrenia, back pain, polysubstance use (heroin, marijuana, tobacco, alcohol), and hospitalization from August 10 to August 15 for rhabdo my lysis and compartment syndrome (did not require surgical intervention) who presented with 3 days of productive cough and worsening dyspnea. As above, symptoms were present for about 3 days. He states he has had associated intermittent trace bloody sputum if he has a deep cough and he experiences pleuritic chest pain whenever he inhales deeply. He also endorses a decreased appetite. He denies nausea, vomiting, fever, abdominal pain, constipation, diarrhea. In ED: D-dimer 2.99; CTA PE study showed bilateral PE involving segmental branches of the lower lobes with slight right ventricular enlargement which may represent a degree of strain; CTA also redemonstrated multifocal pneumonia possibly atypical in etiology.   Assessment & Plan:   Principal Problem:   Bilateral pulmonary embolism (HCC) Active Problems:   Polysubstance abuse (HCC)   History of rhabdomyolysis   Multifocal pneumonia  Acute hypoxic respiratory failure in the setting of bilateral pulmonary embolism:  CTA positive for bilateral filling defects  Currently on heparin drip, Coumadin -follow INR Pt has no PCP or safe follow-up disposition, pt will need to be in hospital till INR is therapeutic. Echocardiogram unremarkable; DVT study negative bilaterally. Lab Results  Component Value Date   INR 1.5 (H) 10/25/2019   INR 1.4 (H) 10/24/2019   INR 1.3 (H) 10/23/2019    Questionable multifocal pneumonia, POA:  Patient does not meet sepsis criteria, mild leukocytosis initially but without fever tachycardia or tachypnea Completed ceftriaxone and  azithromycin  Polysubstance use: Reports snorting heroin 3 days prior to admission-  denies injecting any medications Also admits to marijuana alcohol and tobacco use/abuse Continue CIWA protocol Nicotine patch  History of schizophrenia Continue mirtazapine  History of rhabdomyolysis with residual nerve pain Continue gabapentin     DVT prophylaxis:  Heparin gtt --> coumadin transitioning Code Status:   Full Family Communication: None present  Status is: Inpatient  Dispo: The patient is from: Home              Anticipated d/c is to: Home              Anticipated d/c date is: 24 to 48 hours pending INR              Patient currently not medically stable for discharge given ongoing need for IV heparin in the setting of bilateral PE and transition to p.o. anticoagulation prior to discharge safely.  Consultants:   None  Procedures:   None  Antimicrobials:  Azithromycin, ceftriaxone through 10/24/2019  Subjective: Continues to be frustrated about INR levels.  Reports occasional pleuritic chest pain    Objective: Vitals:   10/25/19 0249 10/25/19 0723 10/25/19 1213 10/25/19 1621  BP: (!) 148/73 (!) 142/82 (!) 103/58 115/77  Pulse: 76 77 73 74  Resp: 19 17    Temp: 97.7 F (36.5 C) 99.1 F (37.3 C) 98.6 F (37 C) 98.6 F (37 C)  TempSrc: Oral     SpO2: 99% 100% 99%   Weight:      Height:        Intake/Output Summary (Last 24 hours) at 10/25/2019 1710 Last data filed at 10/25/2019 0835 Gross per 24 hour  Intake 103 ml  Output --  Net  103 ml   Filed Weights   10/19/19 1311 10/19/19 2053  Weight: 112.5 kg 114.2 kg    Examination:  General: NAD   Cardiovascular: S1, S2 present  Respiratory: CTAB  Abdomen: Soft, nontender, nondistended, bowel sounds present  Musculoskeletal: No bilateral pedal edema noted, Noted RLE ankle monitor  Skin: Normal  Psychiatry: Normal mood   Data Reviewed: I have personally reviewed following labs and imaging  studies  CBC: Recent Labs  Lab 10/19/19 1419 10/20/19 0109 10/21/19 0252 10/22/19 0235 10/23/19 0101 10/24/19 0421 10/25/19 0314  WBC 10.9*   < > 8.9 10.6* 8.8 9.3 7.7  NEUTROABS 8.1*  --   --   --   --   --   --   HGB 13.5   < > 11.3* 10.9* 10.9* 10.9* 10.3*  HCT 42.0   < > 35.9* 34.2* 35.2* 35.0* 32.7*  MCV 84.5   < > 86.7 86.8 86.5 85.6 86.1  PLT 252   < > 238 264 288 353 374   < > = values in this interval not displayed.   Basic Metabolic Panel: Recent Labs  Lab 10/19/19 1419 10/20/19 0109  NA 137 139  K 3.6 3.5  CL 99 103  CO2 25 27  GLUCOSE 98 118*  BUN 7 7  CREATININE 0.73 0.87  CALCIUM 8.9 8.8*   GFR: Estimated Creatinine Clearance: 163.4 mL/min (by C-G formula based on SCr of 0.87 mg/dL). Liver Function Tests: Recent Labs  Lab 10/20/19 0109  AST 22  ALT 23  ALKPHOS 52  BILITOT 0.7  PROT 6.4*  ALBUMIN 3.0*   No results for input(s): LIPASE, AMYLASE in the last 168 hours. No results for input(s): AMMONIA in the last 168 hours. Coagulation Profile: Recent Labs  Lab 10/21/19 0252 10/22/19 0235 10/23/19 0101 10/24/19 0421 10/25/19 0314  INR 1.0 1.1 1.3* 1.4* 1.5*   Cardiac Enzymes: No results for input(s): CKTOTAL, CKMB, CKMBINDEX, TROPONINI in the last 168 hours. BNP (last 3 results) No results for input(s): PROBNP in the last 8760 hours. HbA1C: No results for input(s): HGBA1C in the last 72 hours. CBG: No results for input(s): GLUCAP in the last 168 hours. Lipid Profile: No results for input(s): CHOL, HDL, LDLCALC, TRIG, CHOLHDL, LDLDIRECT in the last 72 hours. Thyroid Function Tests: No results for input(s): TSH, T4TOTAL, FREET4, T3FREE, THYROIDAB in the last 72 hours. Anemia Panel: No results for input(s): VITAMINB12, FOLATE, FERRITIN, TIBC, IRON, RETICCTPCT in the last 72 hours. Sepsis Labs: Recent Labs  Lab 10/19/19 1614  LATICACIDVEN 1.8    Recent Results (from the past 240 hour(s))  Respiratory Panel by RT PCR (Flu A&B,  Covid) - Nasopharyngeal Swab     Status: None   Collection Time: 10/19/19  2:37 PM   Specimen: Nasopharyngeal Swab  Result Value Ref Range Status   SARS Coronavirus 2 by RT PCR NEGATIVE NEGATIVE Final    Comment: (NOTE) SARS-CoV-2 target nucleic acids are NOT DETECTED.  The SARS-CoV-2 RNA is generally detectable in upper respiratoy specimens during the acute phase of infection. The lowest concentration of SARS-CoV-2 viral copies this assay can detect is 131 copies/mL. A negative result does not preclude SARS-Cov-2 infection and should not be used as the sole basis for treatment or other patient management decisions. A negative result may occur with  improper specimen collection/handling, submission of specimen other than nasopharyngeal swab, presence of viral mutation(s) within the areas targeted by this assay, and inadequate number of viral copies (<131 copies/mL). A negative  result must be combined with clinical observations, patient history, and epidemiological information. The expected result is Negative.  Fact Sheet for Patients:  https://www.moore.com/  Fact Sheet for Healthcare Providers:  https://www.young.biz/  This test is no t yet approved or cleared by the [place] FDA and  has been authorized for detection and/or diagnosis of SARS-CoV-2 by FDA under an Emergency Use Authorization (EUA). This EUA will remain  in effect (meaning this test can be used) for the duration of the COVID-19 declaration under Section 564(b)(1) of the Act, 21 U.S.C. section 360bbb-3(b)(1), unless the authorization is terminated or revoked sooner.     Influenza A by PCR NEGATIVE NEGATIVE Final   Influenza B by PCR NEGATIVE NEGATIVE Final    Comment: (NOTE) The Xpert Xpress SARS-CoV-2/FLU/RSV assay is intended as an aid in  the diagnosis of influenza from Nasopharyngeal swab specimens and  should not be used as a sole basis for treatment. Nasal  washings and  aspirates are unacceptable for Xpert Xpress SARS-CoV-2/FLU/RSV  testing.  Fact Sheet for Patients: https://www.moore.com/  Fact Sheet for Healthcare Providers: https://www.young.biz/  This test is not yet approved or cleared by the [place] FDA and  has been authorized for detection and/or diagnosis of SARS-CoV-2 by  FDA under an Emergency Use Authorization (EUA). This EUA will remain  in effect (meaning this test can be used) for the duration of the  Covid-19 declaration under Section 564(b)(1) of the Act, 21  U.S.C. section 360bbb-3(b)(1), unless the authorization is  terminated or revoked. Performed at [facility], [address] [place], [place] [address]   Blood culture (routine x 2)     Status: None   Collection Time: 10/19/19  4:57 PM   Specimen: Left Antecubital; Blood  Result Value Ref Range Status   Specimen Description   Final    LEFT ANTECUBITAL Performed at [facility], [address] [place], [place] [address]    Special Requests   Final    BOTTLES DRAWN AEROBIC AND ANAEROBIC Blood Culture adequate volume Performed at [facility], [address] [place], [place] [address]    Culture   Final    NO GROWTH 5 DAYS Performed at [facility], [address], [place], [place] [address]    Report Status 10/24/2019 FINAL  Final  MRSA PCR Screening     Status: None   Collection Time: 10/19/19 10:58 PM   Specimen: Nasal Mucosa; Nasopharyngeal  Result Value Ref Range Status   MRSA by PCR NEGATIVE NEGATIVE Final    Comment:        The GeneXpert MRSA Assay (FDA approved for NASAL specimens only), is one component of a comprehensive MRSA colonization surveillance program. It is not intended to diagnose MRSA infection nor to guide or monitor treatment for MRSA infections. Performed at [facility], [address] [place], [place] [address]      Radiology  Studies: No results found.      Scheduled Meds: . folic acid  1 mg Oral Daily  . gabapentin  100 mg Oral TID  . mirtazapine  15 mg Oral QHS  . multivitamin with minerals  1 tablet Oral Daily  . nicotine  21 mg Transdermal Q2200  . sodium chloride flush  3 mL Intravenous Q12H  . thiamine  100 mg Oral Daily  . Warfarin - Pharmacist Dosing Inpatient   Does not apply q1600   Continuous Infusions: . sodium  chloride 10 mL/hr at 10/19/19 1713  . heparin 2,200 Units/hr (10/25/19 1150)     LOS: 6 days     Briant CedarNkeiruka J Amaya Blakeman, MD [facility]  If 7PM-7AM, please contact night-coverage [email or web address]  10/25/2019, 5:10 PM

## 2019-10-25 NOTE — Progress Notes (Signed)
Made aware by nurse tech that patient was c/o pain. Upon arrival to patient's room, patient became verbally aggressive and angry towards this RN. Patient states "You're late. My pain medication was due at 4:00pm." I informed patient that his Tylenol was not scheduled, and that if he wanted it, he would have to ask for it. Patient became increasingly loud towards RN. States "I know what medications I am supposed to get, and you're late. Just give me my medications and leave." This RN informed patient that he would not be allowed to talk to staff in this manner. Patient states "get out of my room now".

## 2019-10-25 NOTE — Progress Notes (Signed)
Upon entering into patient's room to do morning assessments, patient was unwilling to interact with nurse. Patient unwilling to answer any questions. Upon asking patient if they needed anything this morning, patient responded aggressively toward nurse stating "Obviously if I didn't say anything, I don't need anything. I don't even know why they assigned you today, but you will see how it is. Don't worry. Leave my room". Charge nurse made aware.

## 2019-10-25 NOTE — Progress Notes (Signed)
ANTICOAGULATION CONSULT NOTE  Pharmacy Consult:  Heparin / Coumadin Indication: pulmonary embolus  Allergies  Allergen Reactions  . Haloperidol Lactate Other (See Comments)  . Penicillins Hives   Patient Measurements: Height: 6' (182.9 cm) Weight: 114.2 kg (251 lb 12.3 oz) IBW/kg (Calculated) : 77.6 Heparin Dosing Weight: 102.2kg  Vital Signs: Temp: 99.1 F (37.3 C) (10/14 0723) Temp Source: Oral (10/14 0249) BP: 142/82 (10/14 0723) Pulse Rate: 77 (10/14 0723)  Labs: Recent Labs    10/23/19 0101 10/23/19 0101 10/24/19 0421 10/25/19 0314  HGB 10.9*   < > 10.9* 10.3*  HCT 35.2*  --  35.0* 32.7*  PLT 288  --  353 374  LABPROT 16.0*  --  16.5* 17.4*  INR 1.3*  --  1.4* 1.5*  HEPARINUNFRC 0.52  --  0.43 0.47   < > = values in this interval not displayed.    Estimated Creatinine Clearance: 163.4 mL/min (by C-G formula based on SCr of 0.87 mg/dL).   Assessment: 29 year old male presenting with cough and pleuritic chest pain found to have bilateral PE with evidence of mild R heart strain.  Pharmacy also consulted to bridge IV heparin to Coumadin.  Heparin level remains therapeutic at 0.47 this AM on rate of 2200 units/hr.  INR is slowly trending up; no bleeding reported. H/H is low stable for last 3 days. Platelets elevated  Goal of Therapy:  INR 2-3 Heparin level 0.3-0.7 units/ml Monitor platelets by anticoagulation protocol: Yes   Plan:  Continue heparin gtt at 2200 units/hr Warfarin 15 mg x1 Daily heparin level, PT/INR and CBC  Elmer Sow, PharmD, BCPS, BCCCP Clinical Pharmacist [phone]  Please check AMION for all [facility] Pharmacy numbers  10/25/2019 8:12 AM

## 2019-10-26 LAB — PROTIME-INR
INR: 1.7 — ABNORMAL HIGH (ref 0.8–1.2)
Prothrombin Time: 19.4 seconds — ABNORMAL HIGH (ref 11.4–15.2)

## 2019-10-26 LAB — BASIC METABOLIC PANEL
Anion gap: 10 (ref 5–15)
BUN: 13 mg/dL (ref 6–20)
CO2: 25 mmol/L (ref 22–32)
Calcium: 9 mg/dL (ref 8.9–10.3)
Chloride: 102 mmol/L (ref 98–111)
Creatinine, Ser: 0.9 mg/dL (ref 0.61–1.24)
GFR, Estimated: >60 mL/min (ref >60)
Glucose, Bld: 90 mg/dL (ref 70–99)
Potassium: 4.1 mmol/L (ref 3.5–5.1)
Sodium: 137 mmol/L (ref 135–145)

## 2019-10-26 LAB — CBC
HCT: 36.7 % — ABNORMAL LOW (ref 39.0–52.0)
Hemoglobin: 11.5 g/dL — ABNORMAL LOW (ref 13.0–17.0)
MCH: 27.2 pg (ref 26.0–34.0)
MCHC: 31.3 g/dL (ref 30.0–36.0)
MCV: 86.8 fL (ref 80.0–100.0)
Platelets: 478 10*3/uL — ABNORMAL HIGH (ref 150–400)
RBC: 4.23 MIL/uL (ref 4.22–5.81)
RDW: 13 % (ref 11.5–15.5)
WBC: 8 10*3/uL (ref 4.0–10.5)
nRBC: 0 % (ref 0.0–0.2)

## 2019-10-26 LAB — HEPARIN LEVEL (UNFRACTIONATED)
Heparin Unfractionated: 0.14 IU/mL — ABNORMAL LOW (ref 0.30–0.70)
Heparin Unfractionated: 0.58 IU/mL (ref 0.30–0.70)

## 2019-10-26 MED ORDER — WARFARIN SODIUM 7.5 MG PO TABS
15.0000 mg | ORAL_TABLET | Freq: Once | ORAL | Status: AC
  Administered 2019-10-26: 15 mg via ORAL
  Filled 2019-10-26: qty 2

## 2019-10-26 NOTE — Progress Notes (Signed)
MD ok with patient to be tele patient/downgrade from progressive.

## 2019-10-26 NOTE — Progress Notes (Signed)
ANTICOAGULATION CONSULT NOTE  Pharmacy Consult:  Heparin / Coumadin Indication: pulmonary embolus  Allergies  Allergen Reactions  . Haloperidol Lactate Other (See Comments)  . Penicillins Hives   Patient Measurements: Height: 6' (182.9 cm) Weight: 114.2 kg (251 lb 12.3 oz) IBW/kg (Calculated) : 77.6 Heparin Dosing Weight: 102.2kg  Vital Signs: Temp: 98.1 F (36.7 C) (10/15 0932) Temp Source: Oral (10/15 0324) BP: 131/90 (10/15 0932) Pulse Rate: 86 (10/15 0932)  Labs: Recent Labs    10/24/19 0421 10/24/19 0421 10/25/19 0314 10/26/19 0201 10/26/19 1021  HGB 10.9*   < > 10.3* 11.5*  --   HCT 35.0*  --  32.7* 36.7*  --   PLT 353  --  374 478*  --   LABPROT 16.5*  --  17.4* 19.4*  --   INR 1.4*  --  1.5* 1.7*  --   HEPARINUNFRC 0.43   < > 0.47 0.14* 0.58  CREATININE  --   --   --  0.90  --    < > = values in this interval not displayed.    Estimated Creatinine Clearance: 157.9 mL/min (by C-G formula based on SCr of 0.9 mg/dL).   Assessment: 29 year old male presenting with cough and pleuritic chest pain found to have bilateral PE with evidence of mild R heart strain.  Pharmacy also consulted to bridge IV heparin to Coumadin.  Heparin level remains therapeutic on rate of 2300 units/hr.  INR is slowly trending up; no bleeding reported. H/H is low stable for last 3 days. Platelets elevated  Goal of Therapy:  INR 2-3 Heparin level 0.3-0.7 units/ml Monitor platelets by anticoagulation protocol: Yes   Plan:  Continue heparin gtt at 2300 units/hr Warfarin 15 mg x1 Daily heparin level, PT/INR and CBC  Elmer Sow, PharmD, BCPS, BCCCP Clinical Pharmacist [phone]  Please check AMION for all [facility] Pharmacy numbers  10/26/2019 11:50 AM

## 2019-10-26 NOTE — Progress Notes (Signed)
ANTICOAGULATION CONSULT NOTE  Pharmacy Consult:  Heparin / Coumadin Indication: pulmonary embolus  Allergies  Allergen Reactions  . Haloperidol Lactate Other (See Comments)  . Penicillins Hives   Patient Measurements: Height: 6' (182.9 cm) Weight: 114.2 kg (251 lb 12.3 oz) IBW/kg (Calculated) : 77.6 Heparin Dosing Weight: 102.2kg  Vital Signs: Temp: 98.5 F (36.9 C) (10/15 0324) Temp Source: Oral (10/15 0324) BP: 132/98 (10/15 0324) Pulse Rate: 86 (10/15 0324)  Labs: Recent Labs    10/24/19 0421 10/24/19 0421 10/25/19 0314 10/26/19 0201  HGB 10.9*   < > 10.3* 11.5*  HCT 35.0*  --  32.7* 36.7*  PLT 353  --  374 478*  LABPROT 16.5*  --  17.4* 19.4*  INR 1.4*  --  1.5* 1.7*  HEPARINUNFRC 0.43  --  0.47 0.14*   < > = values in this interval not displayed.    Estimated Creatinine Clearance: 163.4 mL/min (by C-G formula based on SCr of 0.87 mg/dL).   Assessment: 29 year old male presenting with cough and pleuritic chest pain found to have bilateral PE with evidence of mild R heart strain.  Pharmacy also consulted to bridge IV heparin to Coumadin.  Heparin level down to subtherapeutic (0.14) on gtt at 2200 units/hr. Heparin was off for ~35 min around midnight so that pt could take a bath/shower  - this is likely cause of decreased level. No bleeding noted.  Goal of Therapy:  INR 2-3 Heparin level 0.3-0.7 units/ml Monitor platelets by anticoagulation protocol: Yes   Plan:  Increase heparin gtt slightly to 2300 units/hr F/u 6 hr heparin level  Christoper Fabian, PharmD, BCPS Please see [email or web address] for complete clinical pharmacist phone list 10/26/2019 3:45 AM

## 2019-10-26 NOTE — Progress Notes (Signed)
PROGRESS NOTE    Andrew [name]  MHD:[id number] DOB: [birth year] DOA: 10/19/2019 PCP: Patient, No Pcp Per   Brief Narrative:  Andrew [name] is a 29 y.o. male with medical history significant of depression, schizophrenia, back pain, polysubstance use (heroin, marijuana, tobacco, alcohol), and hospitalization from August 10 to August 15 for rhabdo my lysis and compartment syndrome (did not require surgical intervention) who presented with 3 days of productive cough and worsening dyspnea. As above, symptoms were present for about 3 days. He states he has had associated intermittent trace bloody sputum if he has a deep cough and he experiences pleuritic chest pain whenever he inhales deeply. He also endorses a decreased appetite. He denies nausea, vomiting, fever, abdominal pain, constipation, diarrhea. In ED: D-dimer 2.99; CTA PE study showed bilateral PE involving segmental branches of the lower lobes with slight right ventricular enlargement which may represent a degree of strain; CTA also redemonstrated multifocal pneumonia possibly atypical in etiology.     Assessment & Plan:   Principal Problem:   Bilateral pulmonary embolism (HCC) Active Problems:   Polysubstance abuse (HCC)   History of rhabdomyolysis   Multifocal pneumonia  Acute hypoxic respiratory failure in the setting of bilateral pulmonary embolism:  CTA positive for bilateral filling defects  Currently on heparin drip, Coumadin -follow INR Pt has no PCP or safe follow-up disposition, pt will need to be in hospital till INR is therapeutic. Echocardiogram unremarkable; DVT study negative bilaterally. Lab Results  Component Value Date   INR 1.7 (H) 10/26/2019   INR 1.5 (H) 10/25/2019   INR 1.4 (H) 10/24/2019    Questionable multifocal pneumonia, POA:  Patient does not meet sepsis criteria, mild leukocytosis initially but without fever tachycardia or tachypnea Completed ceftriaxone and  azithromycin  Polysubstance use: Reports snorting heroin 3 days prior to admission-  denies injecting any medications Also admits to marijuana alcohol and tobacco use/abuse Continue CIWA protocol Nicotine patch  History of schizophrenia Continue mirtazapine  History of rhabdomyolysis with residual nerve pain Continue gabapentin     DVT prophylaxis:  Heparin gtt --> coumadin transitioning Code Status:   Full Family Communication: None present  Status is: Inpatient  Dispo: The patient is from: Home              Anticipated d/c is to: Home              Anticipated d/c date is: 24 to 48 hours pending INR              Patient currently not medically stable for discharge given ongoing need for IV heparin in the setting of bilateral PE and transition to p.o. anticoagulation prior to discharge safely.  Consultants:   None  Procedures:   None  Antimicrobials:  Azithromycin, ceftriaxone through 10/24/2019  Subjective: Denies any new complaints    Objective: Vitals:   10/25/19 2250 10/26/19 0324 10/26/19 0932 10/26/19 1236  BP: 113/74 (!) 132/98 131/90 136/90  Pulse: 83 86 86 79  Resp: 20 18  18   Temp: 98.4 F (36.9 C) 98.5 F (36.9 C) 98.1 F (36.7 C) 98.4 F (36.9 C)  TempSrc: Oral Oral  Oral  SpO2: 100% 100% 98% 98%  Weight:      Height:        Intake/Output Summary (Last 24 hours) at 10/26/2019 1642 Last data filed at 10/26/2019 0900 Gross per 24 hour  Intake 236 ml  Output --  Net 236 ml   Filed Weights   10/19/19  1311 10/19/19 2053  Weight: 112.5 kg 114.2 kg    Examination:  General: NAD   Cardiovascular: S1, S2 present  Respiratory: CTAB  Abdomen: Soft, nontender, nondistended, bowel sounds present  Musculoskeletal: No bilateral pedal edema noted, Noted RLE ankle monitor  Skin: Normal  Psychiatry: Normal mood   Data Reviewed: I have personally reviewed following labs and imaging studies  CBC: Recent Labs  Lab 10/22/19 0235  10/23/19 0101 10/24/19 0421 10/25/19 0314 10/26/19 0201  WBC 10.6* 8.8 9.3 7.7 8.0  HGB 10.9* 10.9* 10.9* 10.3* 11.5*  HCT 34.2* 35.2* 35.0* 32.7* 36.7*  MCV 86.8 86.5 85.6 86.1 86.8  PLT 264 288 353 374 478*   Basic Metabolic Panel: Recent Labs  Lab 10/20/19 0109 10/26/19 0201  NA 139 137  K 3.5 4.1  CL 103 102  CO2 27 25  GLUCOSE 118* 90  BUN 7 13  CREATININE 0.87 0.90  CALCIUM 8.8* 9.0   GFR: Estimated Creatinine Clearance: 157.9 mL/min (by C-G formula based on SCr of 0.9 mg/dL). Liver Function Tests: Recent Labs  Lab 10/20/19 0109  AST 22  ALT 23  ALKPHOS 52  BILITOT 0.7  PROT 6.4*  ALBUMIN 3.0*   No results for input(s): LIPASE, AMYLASE in the last 168 hours. No results for input(s): AMMONIA in the last 168 hours. Coagulation Profile: Recent Labs  Lab 10/22/19 0235 10/23/19 0101 10/24/19 0421 10/25/19 0314 10/26/19 0201  INR 1.1 1.3* 1.4* 1.5* 1.7*   Cardiac Enzymes: No results for input(s): CKTOTAL, CKMB, CKMBINDEX, TROPONINI in the last 168 hours. BNP (last 3 results) No results for input(s): PROBNP in the last 8760 hours. HbA1C: No results for input(s): HGBA1C in the last 72 hours. CBG: No results for input(s): GLUCAP in the last 168 hours. Lipid Profile: No results for input(s): CHOL, HDL, LDLCALC, TRIG, CHOLHDL, LDLDIRECT in the last 72 hours. Thyroid Function Tests: No results for input(s): TSH, T4TOTAL, FREET4, T3FREE, THYROIDAB in the last 72 hours. Anemia Panel: No results for input(s): VITAMINB12, FOLATE, FERRITIN, TIBC, IRON, RETICCTPCT in the last 72 hours. Sepsis Labs: No results for input(s): PROCALCITON, LATICACIDVEN in the last 168 hours.  Recent Results (from the past 240 hour(s))  Respiratory Panel by RT PCR (Flu A&B, Covid) - Nasopharyngeal Swab     Status: None   Collection Time: 10/19/19  2:37 PM   Specimen: Nasopharyngeal Swab  Result Value Ref Range Status   SARS Coronavirus 2 by RT PCR NEGATIVE NEGATIVE Final     Comment: (NOTE) SARS-CoV-2 target nucleic acids are NOT DETECTED.  The SARS-CoV-2 RNA is generally detectable in upper respiratoy specimens during the acute phase of infection. The lowest concentration of SARS-CoV-2 viral copies this assay can detect is 131 copies/mL. A negative result does not preclude SARS-Cov-2 infection and should not be used as the sole basis for treatment or other patient management decisions. A negative result may occur with  improper specimen collection/handling, submission of specimen other than nasopharyngeal swab, presence of viral mutation(s) within the areas targeted by this assay, and inadequate number of viral copies (<131 copies/mL). A negative result must be combined with clinical observations, patient history, and epidemiological information. The expected result is Negative.  Fact Sheet for Patients:  https://www.moore.com/  Fact Sheet for Healthcare Providers:  https://www.young.biz/  This test is no t yet approved or cleared by the [place] FDA and  has been authorized for detection and/or diagnosis of SARS-CoV-2 by FDA under an Emergency Use Authorization (EUA). This EUA will remain  in effect (meaning this test can be used) for the duration of the COVID-19 declaration under Section 564(b)(1) of the Act, 21 U.S.C. section 360bbb-3(b)(1), unless the authorization is terminated or revoked sooner.     Influenza A by PCR NEGATIVE NEGATIVE Final   Influenza B by PCR NEGATIVE NEGATIVE Final    Comment: (NOTE) The Xpert Xpress SARS-CoV-2/FLU/RSV assay is intended as an aid in  the diagnosis of influenza from Nasopharyngeal swab specimens and  should not be used as a sole basis for treatment. Nasal washings and  aspirates are unacceptable for Xpert Xpress SARS-CoV-2/FLU/RSV  testing.  Fact Sheet for Patients: https://www.moore.com/  Fact Sheet for Healthcare  Providers: https://www.young.biz/  This test is not yet approved or cleared by the [place] FDA and  has been authorized for detection and/or diagnosis of SARS-CoV-2 by  FDA under an Emergency Use Authorization (EUA). This EUA will remain  in effect (meaning this test can be used) for the duration of the  Covid-19 declaration under Section 564(b)(1) of the Act, 21  U.S.C. section 360bbb-3(b)(1), unless the authorization is  terminated or revoked. Performed at [facility], [address] [place], [place] [address]   Blood culture (routine x 2)     Status: None   Collection Time: 10/19/19  4:57 PM   Specimen: Left Antecubital; Blood  Result Value Ref Range Status   Specimen Description   Final    LEFT ANTECUBITAL Performed at [facility], [address] [place], [place] [address]    Special Requests   Final    BOTTLES DRAWN AEROBIC AND ANAEROBIC Blood Culture adequate volume Performed at [facility], [address] [place], [place] [address]    Culture   Final    NO GROWTH 5 DAYS Performed at [facility], [address], [place], [place] [address]    Report Status 10/24/2019 FINAL  Final  MRSA PCR Screening     Status: None   Collection Time: 10/19/19 10:58 PM   Specimen: Nasal Mucosa; Nasopharyngeal  Result Value Ref Range Status   MRSA by PCR NEGATIVE NEGATIVE Final    Comment:        The GeneXpert MRSA Assay (FDA approved for NASAL specimens only), is one component of a comprehensive MRSA colonization surveillance program. It is not intended to diagnose MRSA infection nor to guide or monitor treatment for MRSA infections. Performed at [facility], [address], [place], [place] [address]      Radiology Studies: No results found.      Scheduled Meds: . folic acid  1 mg Oral Daily  . gabapentin  100 mg Oral TID  . mirtazapine  15 mg Oral QHS  . multivitamin with minerals  1 tablet  Oral Daily  . nicotine  21 mg Transdermal Q2200  . sodium chloride flush  3 mL Intravenous Q12H  . thiamine  100 mg Oral Daily  . Warfarin - Pharmacist Dosing Inpatient   Does not apply q1600   Continuous Infusions: . sodium chloride 10 mL/hr at 10/19/19 1713  . heparin 2,300 Units/hr (10/26/19 1233)     LOS: 7 days     Briant Cedar, MD [facility]  If 7PM-7AM, please contact night-coverage [email or web address]  10/26/2019, 4:42 PM

## 2019-10-27 LAB — HEPARIN LEVEL (UNFRACTIONATED): Heparin Unfractionated: 0.42 IU/mL (ref 0.30–0.70)

## 2019-10-27 LAB — CBC
HCT: 37.2 % — ABNORMAL LOW (ref 39.0–52.0)
Hemoglobin: 11.5 g/dL — ABNORMAL LOW (ref 13.0–17.0)
MCH: 26.6 pg (ref 26.0–34.0)
MCHC: 30.9 g/dL (ref 30.0–36.0)
MCV: 86.1 fL (ref 80.0–100.0)
Platelets: 534 10*3/uL — ABNORMAL HIGH (ref 150–400)
RBC: 4.32 MIL/uL (ref 4.22–5.81)
RDW: 12.8 % (ref 11.5–15.5)
WBC: 7.4 10*3/uL (ref 4.0–10.5)
nRBC: 0 % (ref 0.0–0.2)

## 2019-10-27 LAB — PROTIME-INR
INR: 1.8 — ABNORMAL HIGH (ref 0.8–1.2)
Prothrombin Time: 20.2 seconds — ABNORMAL HIGH (ref 11.4–15.2)

## 2019-10-27 MED ORDER — WARFARIN SODIUM 7.5 MG PO TABS
15.0000 mg | ORAL_TABLET | Freq: Once | ORAL | Status: AC
  Administered 2019-10-27: 15 mg via ORAL
  Filled 2019-10-27: qty 2

## 2019-10-27 NOTE — Progress Notes (Signed)
PROGRESS NOTE    Andrew [name]  OMV:[id number] DOB: 08-10-1990 DOA: 10/19/2019 PCP: Patient, No Pcp Per   Brief Narrative:  Andrew [name] is a 29 y.o. male with medical history significant of depression, schizophrenia, back pain, polysubstance use (heroin, marijuana, tobacco, alcohol), and hospitalization from August 10 to August 15 for rhabdo my lysis and compartment syndrome (did not require surgical intervention) who presented with 3 days of productive cough and worsening dyspnea. As above, symptoms were present for about 3 days. He states he has had associated intermittent trace bloody sputum if he has a deep cough and he experiences pleuritic chest pain whenever he inhales deeply. He also endorses a decreased appetite. He denies nausea, vomiting, fever, abdominal pain, constipation, diarrhea. In ED: D-dimer 2.99; CTA PE study showed bilateral PE involving segmental branches of the lower lobes with slight right ventricular enlargement which may represent a degree of strain; CTA also redemonstrated multifocal pneumonia possibly atypical in etiology.     Assessment & Plan:   Principal Problem:   Bilateral pulmonary embolism (HCC) Active Problems:   Polysubstance abuse (HCC)   History of rhabdomyolysis   Multifocal pneumonia  Acute hypoxic respiratory failure in the setting of bilateral pulmonary embolism:  CTA positive for bilateral filling defects  Currently on heparin drip, Coumadin -follow INR Pt has no PCP or safe follow-up disposition, pt will need to be in hospital till INR is therapeutic. Echocardiogram unremarkable; DVT study negative bilaterally. Lab Results  Component Value Date   INR 1.8 (H) 10/27/2019   INR 1.7 (H) 10/26/2019   INR 1.5 (H) 10/25/2019    Questionable multifocal pneumonia, POA:  Patient does not meet sepsis criteria, mild leukocytosis initially but without fever tachycardia or tachypnea Completed ceftriaxone and  azithromycin  Polysubstance use: Reports snorting heroin 3 days prior to admission-  denies injecting any medications Also admits to marijuana alcohol and tobacco use/abuse Continue CIWA protocol Nicotine patch  History of schizophrenia Continue mirtazapine  History of rhabdomyolysis with residual nerve pain Continue gabapentin     DVT prophylaxis:  Heparin gtt --> coumadin transitioning Code Status:   Full Family Communication: None present  Status is: Inpatient  Dispo: The patient is from: Home              Anticipated d/c is to: Home              Anticipated d/c date is: 24 to 48 hours pending INR              Patient currently not medically stable for discharge given ongoing need for IV heparin in the setting of bilateral PE and transition to p.o. anticoagulation prior to discharge safely.  Consultants:   None  Procedures:   None  Antimicrobials:  Azithromycin, ceftriaxone through 10/24/2019  Subjective: Denies any new complaints, very eager to be discharged    Objective: Vitals:   10/26/19 1236 10/26/19 1644 10/26/19 1929 10/26/19 2111  BP: 136/90 131/83 135/81 136/81  Pulse: 79 76 71 75  Resp: 18 20 18 19   Temp: 98.4 F (36.9 C) 98.1 F (36.7 C) 98.5 F (36.9 C) 98.6 F (37 C)  TempSrc: Oral  Oral Oral  SpO2: 98% 99% 100% 100%  Weight:      Height:        Intake/Output Summary (Last 24 hours) at 10/27/2019 1812 Last data filed at 10/26/2019 2020 Gross per 24 hour  Intake --  Output 3 ml  Net -3 ml   10/28/2019  Weights   10/19/19 1311 10/19/19 2053  Weight: 112.5 kg 114.2 kg    Examination:  General: NAD   Cardiovascular: S1, S2 present  Respiratory: CTAB  Abdomen: Soft, nontender, nondistended, bowel sounds present  Musculoskeletal: No bilateral pedal edema noted, Noted RLE ankle monitor  Skin: Normal  Psychiatry: Normal mood   Data Reviewed: I have personally reviewed following labs and imaging studies  CBC: Recent Labs   Lab 10/23/19 0101 10/24/19 0421 10/25/19 0314 10/26/19 0201 10/27/19 0104  WBC 8.8 9.3 7.7 8.0 7.4  HGB 10.9* 10.9* 10.3* 11.5* 11.5*  HCT 35.2* 35.0* 32.7* 36.7* 37.2*  MCV 86.5 85.6 86.1 86.8 86.1  PLT 288 353 374 478* 534*   Basic Metabolic Panel: Recent Labs  Lab 10/26/19 0201  NA 137  K 4.1  CL 102  CO2 25  GLUCOSE 90  BUN 13  CREATININE 0.90  CALCIUM 9.0   GFR: Estimated Creatinine Clearance: 157.9 mL/min (by C-G formula based on SCr of 0.9 mg/dL). Liver Function Tests: No results for input(s): AST, ALT, ALKPHOS, BILITOT, PROT, ALBUMIN in the last 168 hours. No results for input(s): LIPASE, AMYLASE in the last 168 hours. No results for input(s): AMMONIA in the last 168 hours. Coagulation Profile: Recent Labs  Lab 10/23/19 0101 10/24/19 0421 10/25/19 0314 10/26/19 0201 10/27/19 0104  INR 1.3* 1.4* 1.5* 1.7* 1.8*   Cardiac Enzymes: No results for input(s): CKTOTAL, CKMB, CKMBINDEX, TROPONINI in the last 168 hours. BNP (last 3 results) No results for input(s): PROBNP in the last 8760 hours. HbA1C: No results for input(s): HGBA1C in the last 72 hours. CBG: No results for input(s): GLUCAP in the last 168 hours. Lipid Profile: No results for input(s): CHOL, HDL, LDLCALC, TRIG, CHOLHDL, LDLDIRECT in the last 72 hours. Thyroid Function Tests: No results for input(s): TSH, T4TOTAL, FREET4, T3FREE, THYROIDAB in the last 72 hours. Anemia Panel: No results for input(s): VITAMINB12, FOLATE, FERRITIN, TIBC, IRON, RETICCTPCT in the last 72 hours. Sepsis Labs: No results for input(s): PROCALCITON, LATICACIDVEN in the last 168 hours.  Recent Results (from the past 240 hour(s))  Respiratory Panel by RT PCR (Flu A&B, Covid) - Nasopharyngeal Swab     Status: None   Collection Time: 10/19/19  2:37 PM   Specimen: Nasopharyngeal Swab  Result Value Ref Range Status   SARS Coronavirus 2 by RT PCR NEGATIVE NEGATIVE Final    Comment: (NOTE) SARS-CoV-2 target nucleic  acids are NOT DETECTED.  The SARS-CoV-2 RNA is generally detectable in upper respiratoy specimens during the acute phase of infection. The lowest concentration of SARS-CoV-2 viral copies this assay can detect is 131 copies/mL. A negative result does not preclude SARS-Cov-2 infection and should not be used as the sole basis for treatment or other patient management decisions. A negative result may occur with  improper specimen collection/handling, submission of specimen other than nasopharyngeal swab, presence of viral mutation(s) within the areas targeted by this assay, and inadequate number of viral copies (<131 copies/mL). A negative result must be combined with clinical observations, patient history, and epidemiological information. The expected result is Negative.  Fact Sheet for Patients:  https://www.moore.com/  Fact Sheet for Healthcare Providers:  https://www.young.biz/  This test is no t yet approved or cleared by the [place] FDA and  has been authorized for detection and/or diagnosis of SARS-CoV-2 by FDA under an Emergency Use Authorization (EUA). This EUA will remain  in effect (meaning this test can be used) for the duration of the COVID-19 declaration under  Section 564(b)(1) of the Act, 21 U.S.C. section 360bbb-3(b)(1), unless the authorization is terminated or revoked sooner.     Influenza A by PCR NEGATIVE NEGATIVE Final   Influenza B by PCR NEGATIVE NEGATIVE Final    Comment: (NOTE) The Xpert Xpress SARS-CoV-2/FLU/RSV assay is intended as an aid in  the diagnosis of influenza from Nasopharyngeal swab specimens and  should not be used as a sole basis for treatment. Nasal washings and  aspirates are unacceptable for Xpert Xpress SARS-CoV-2/FLU/RSV  testing.  Fact Sheet for Patients: https://www.moore.com/  Fact Sheet for Healthcare Providers: https://www.young.biz/  This test  is not yet approved or cleared by the [place] FDA and  has been authorized for detection and/or diagnosis of SARS-CoV-2 by  FDA under an Emergency Use Authorization (EUA). This EUA will remain  in effect (meaning this test can be used) for the duration of the  Covid-19 declaration under Section 564(b)(1) of the Act, 21  U.S.C. section 360bbb-3(b)(1), unless the authorization is  terminated or revoked. Performed at [facility], [address] [place], [place] [address]   Blood culture (routine x 2)     Status: None   Collection Time: 10/19/19  4:57 PM   Specimen: Left Antecubital; Blood  Result Value Ref Range Status   Specimen Description   Final    LEFT ANTECUBITAL Performed at [facility], [address] [place], [place] [address]    Special Requests   Final    BOTTLES DRAWN AEROBIC AND ANAEROBIC Blood Culture adequate volume Performed at [facility], [address] [place], [place] [address]    Culture   Final    NO GROWTH 5 DAYS Performed at [facility], [address], [place], [place] [address]    Report Status 10/24/2019 FINAL  Final  MRSA PCR Screening     Status: None   Collection Time: 10/19/19 10:58 PM   Specimen: Nasal Mucosa; Nasopharyngeal  Result Value Ref Range Status   MRSA by PCR NEGATIVE NEGATIVE Final    Comment:        The GeneXpert MRSA Assay (FDA approved for NASAL specimens only), is one component of a comprehensive MRSA colonization surveillance program. It is not intended to diagnose MRSA infection nor to guide or monitor treatment for MRSA infections. Performed at [facility], [address], [place], [place] [address]      Radiology Studies: No results found.      Scheduled Meds: . folic acid  1 mg Oral Daily  . gabapentin  100 mg Oral TID  . mirtazapine  15 mg Oral QHS  . multivitamin with minerals  1 tablet Oral Daily  . nicotine  21 mg Transdermal Q2200  . sodium  chloride flush  3 mL Intravenous Q12H  . thiamine  100 mg Oral Daily  . Warfarin - Pharmacist Dosing Inpatient   Does not apply q1600   Continuous Infusions: . sodium chloride 10 mL/hr at 10/19/19 1713  . heparin 2,300 Units/hr (10/27/19 1612)     LOS: 8 days     Briant Cedar, MD [facility]  If 7PM-7AM, please contact night-coverage [email or web address]  10/27/2019, 6:12 PM

## 2019-10-27 NOTE — Progress Notes (Signed)
ANTICOAGULATION CONSULT NOTE  Pharmacy Consult:  Heparin / warfarin Indication: pulmonary embolus  Allergies  Allergen Reactions  . Haloperidol Lactate Other (See Comments)  . Penicillins Hives   Patient Measurements: Height: 6' (182.9 cm) Weight: 114.2 kg (251 lb 12.3 oz) IBW/kg (Calculated) : 77.6 Heparin Dosing Weight: 102.2kg  Vital Signs: Temp: 98.6 F (37 C) (10/15 2111) Temp Source: Oral (10/15 2111) BP: 136/81 (10/15 2111) Pulse Rate: 75 (10/15 2111)  Labs: Recent Labs    10/25/19 0314 10/25/19 0314 10/26/19 0201 10/26/19 1021 10/27/19 0104  HGB 10.3*   < > 11.5*  --  11.5*  HCT 32.7*  --  36.7*  --  37.2*  PLT 374  --  478*  --  534*  LABPROT 17.4*  --  19.4*  --  20.2*  INR 1.5*  --  1.7*  --  1.8*  HEPARINUNFRC 0.47   < > 0.14* 0.58 0.42  CREATININE  --   --  0.90  --   --    < > = values in this interval not displayed.    Estimated Creatinine Clearance: 157.9 mL/min (by C-G formula based on SCr of 0.9 mg/dL).   Assessment: 29 year old male presenting with cough and pleuritic chest pain found to have bilateral PE with evidence of mild R heart strain.  Pharmacy also consulted to bridge IV heparin to warfarin. Pt has no insurance.  Heparin level remains therapeutic on rate of 2300 units/hr.  INR is slowly trending up to 1.8 today; no bleeding reported. H/H is low stable for last 3 days. Platelets elevated  Goal of Therapy:  INR 2-3 Heparin level 0.3-0.7 units/ml Monitor platelets by anticoagulation protocol: Yes   Plan:  Continue heparin gtt at 2300 units/hr Warfarin 15 mg x1 Daily heparin level, PT/INR and CBC   Thank you for allowing [place] to participate in this patients care.   Signe Colt, PharmD Please see amion for complete clinical pharmacist phone list. 10/27/2019 9:08 AM

## 2019-10-28 LAB — CBC
HCT: 36 % — ABNORMAL LOW (ref 39.0–52.0)
Hemoglobin: 11.3 g/dL — ABNORMAL LOW (ref 13.0–17.0)
MCH: 27.2 pg (ref 26.0–34.0)
MCHC: 31.4 g/dL (ref 30.0–36.0)
MCV: 86.5 fL (ref 80.0–100.0)
Platelets: 566 10*3/uL — ABNORMAL HIGH (ref 150–400)
RBC: 4.16 MIL/uL — ABNORMAL LOW (ref 4.22–5.81)
RDW: 13 % (ref 11.5–15.5)
WBC: 8.9 10*3/uL (ref 4.0–10.5)
nRBC: 0 % (ref 0.0–0.2)

## 2019-10-28 LAB — HEPARIN LEVEL (UNFRACTIONATED): Heparin Unfractionated: 0.64 IU/mL (ref 0.30–0.70)

## 2019-10-28 LAB — PROTIME-INR
INR: 2.2 — ABNORMAL HIGH (ref 0.8–1.2)
Prothrombin Time: 23.6 seconds — ABNORMAL HIGH (ref 11.4–15.2)

## 2019-10-28 MED ORDER — WARFARIN SODIUM 7.5 MG PO TABS
15.0000 mg | ORAL_TABLET | Freq: Once | ORAL | Status: AC
  Administered 2019-10-28: 15 mg via ORAL
  Filled 2019-10-28: qty 2

## 2019-10-28 NOTE — Progress Notes (Signed)
ANTICOAGULATION CONSULT NOTE  Pharmacy Consult:  Heparin / warfarin Indication: pulmonary embolus  Allergies  Allergen Reactions  . Haloperidol Lactate Other (See Comments)  . Penicillins Hives   Patient Measurements: Height: 6' (182.9 cm) Weight: 114.2 kg (251 lb 12.3 oz) IBW/kg (Calculated) : 77.6 Heparin Dosing Weight: 102.2kg  Vital Signs: Temp: 98.3 F (36.8 C) (10/16 2114) Temp Source: Oral (10/16 2114) BP: 129/78 (10/16 2114) Pulse Rate: 82 (10/16 2114)  Labs: Recent Labs    10/26/19 0201 10/26/19 0201 10/26/19 1021 10/27/19 0104 10/28/19 0123  HGB 11.5*   < >  --  11.5* 11.3*  HCT 36.7*  --   --  37.2* 36.0*  PLT 478*  --   --  534* 566*  LABPROT 19.4*  --   --  20.2* 23.6*  INR 1.7*  --   --  1.8* 2.2*  HEPARINUNFRC 0.14*   < > 0.58 0.42 0.64  CREATININE 0.90  --   --   --   --    < > = values in this interval not displayed.    Estimated Creatinine Clearance: 157.9 mL/min (by C-G formula based on SCr of 0.9 mg/dL).   Assessment: 29 year old male presenting with cough and pleuritic chest pain found to have bilateral PE with evidence of mild R heart strain.  Pharmacy also consulted to bridge IV heparin to warfarin. Pt has no insurance.  Heparin level remains therapeutic on rate of 2300 units/hr.  INR has reached the therapeutic range today at 2.2; no bleeding reported. Heparin should be continued to overlap with an INR >2 for at least 24 hours since he has active PEs. H/H is low stable for last 3 days. Platelets elevated  Goal of Therapy:  INR 2-3 Heparin level 0.3-0.7 units/ml Monitor platelets by anticoagulation protocol: Yes   Plan:  Continue heparin gtt at 2300 units/hr.  If INR remains >2 on 10/18 recommend stopping heparin overlap. Warfarin 15 mg x1 today Daily heparin level, PT/INR and CBC   Estella Husk, PharmD, BCPS, BCIDP Clinical Pharmacist Phone: [phone] Please check AMION for all [facility] numbers 10/28/2019, 8:41  AM

## 2019-10-28 NOTE — Progress Notes (Signed)
Andrew NOTE    Montreal Steidle  WIO:[id number] DOB: 1990-04-09 DOA: 10/19/2019 PCP: [name], Andrew [name]   Brief Narrative:  Andrew [name] is a 29 y.o. male with medical history significant of depression, schizophrenia, back pain, polysubstance use (heroin, marijuana, tobacco, alcohol), and hospitalization from August 10 to August 15 for rhabdo my lysis and compartment syndrome (did not require surgical intervention) who presented with 3 days of productive cough and worsening dyspnea. As above, symptoms were present for about 3 days. He states he has had associated intermittent trace bloody sputum if he has a deep cough and he experiences pleuritic chest pain whenever he inhales deeply. He also endorses a decreased appetite. He denies nausea, vomiting, fever, abdominal pain, constipation, diarrhea. In ED: D-dimer 2.99; CTA PE study showed bilateral PE involving segmental branches of the lower lobes with slight right ventricular enlargement which may represent a degree of strain; CTA also redemonstrated multifocal pneumonia possibly atypical in etiology.     Assessment & Plan:   Principal Problem:   Bilateral pulmonary embolism (HCC) Active Problems:   Polysubstance abuse (HCC)   History of rhabdomyolysis   Multifocal pneumonia  Acute hypoxic respiratory failure in the setting of bilateral pulmonary embolism:  CTA positive for bilateral filling defects  Currently on heparin drip, Coumadin -follow INR Pt has Andrew PCP or safe follow-up disposition, pt will need to be in hospital till INR is therapeutic. Echocardiogram unremarkable; DVT study negative bilaterally. Lab Results  Component Value Date   INR 2.2 (H) 10/28/2019   INR 1.8 (H) 10/27/2019   INR 1.7 (H) 10/26/2019    Questionable multifocal pneumonia, POA:  [name] does not meet sepsis criteria, mild leukocytosis initially but without fever tachycardia or tachypnea Completed ceftriaxone and  azithromycin  Polysubstance use: Reports snorting heroin 3 days prior to admission-  denies injecting any medications Also admits to marijuana alcohol and tobacco use/abuse Nicotine patch  History of schizophrenia Continue mirtazapine  History of rhabdomyolysis with residual nerve pain Continue gabapentin     DVT prophylaxis:  Heparin gtt --> coumadin transitioning Code Status:   Full Family Communication: None present  Status is: Inpatient  Dispo: The [name] is from: Home              Anticipated d/c is to: Home              Anticipated d/c date is: 10/29/19- Pt needs to be set up with coumadin clinic (has Andrew PCP or insurance). Plan is to d/c heparin drip 10/29/19                Consultants:   None  Procedures:   None  Antimicrobials:  Azithromycin, ceftriaxone through 10/24/2019  Subjective: Denies any new complaints.  Had an extensive discussion with [name] about the need to be compliance with Coumadin clinic follow-up.  [name] very eager to be discharged    Objective: Vitals:   10/26/19 1644 10/26/19 1929 10/26/19 2111 10/27/19 2114  BP: 131/83 135/81 136/81 129/78  Pulse: 76 71 75 82  Resp: 20 18 19 16   Temp: 98.1 F (36.7 C) 98.5 F (36.9 C) 98.6 F (37 C) 98.3 F (36.8 C)  TempSrc:  Oral Oral Oral  SpO2: 99% 100% 100% 100%  Weight:      Height:        Intake/Output Summary (Last 24 hours) at 10/28/2019 1315 Last data filed at 10/27/2019 2115 Gross [name] 24 hour  Intake --  Output 3 ml  Net -  3 ml   Filed Weights   10/19/19 1311 10/19/19 2053  Weight: 112.5 kg 114.2 kg    Examination:  General: NAD   Cardiovascular: S1, S2 present  Respiratory: CTAB  Abdomen: Soft, nontender, nondistended, bowel sounds present  Musculoskeletal: Andrew bilateral pedal edema noted, Noted RLE ankle monitor  Skin: Normal  Psychiatry: Normal mood   Data Reviewed: I have personally reviewed following labs and imaging studies  CBC: Recent Labs   Lab 10/24/19 0421 10/25/19 0314 10/26/19 0201 10/27/19 0104 10/28/19 0123  WBC 9.3 7.7 8.0 7.4 8.9  HGB 10.9* 10.3* 11.5* 11.5* 11.3*  HCT 35.0* 32.7* 36.7* 37.2* 36.0*  MCV 85.6 86.1 86.8 86.1 86.5  PLT 353 374 478* 534* 566*   Basic Metabolic Panel: Recent Labs  Lab 10/26/19 0201  NA 137  K 4.1  CL 102  CO2 25  GLUCOSE 90  BUN 13  CREATININE 0.90  CALCIUM 9.0   GFR: Estimated Creatinine Clearance: 157.9 mL/min (by C-G formula based on SCr of 0.9 mg/dL). Liver Function Tests: Andrew results for input(s): AST, ALT, ALKPHOS, BILITOT, PROT, ALBUMIN in the last 168 hours. Andrew results for input(s): LIPASE, AMYLASE in the last 168 hours. Andrew results for input(s): AMMONIA in the last 168 hours. Coagulation Profile: Recent Labs  Lab 10/24/19 0421 10/25/19 0314 10/26/19 0201 10/27/19 0104 10/28/19 0123  INR 1.4* 1.5* 1.7* 1.8* 2.2*   Cardiac Enzymes: Andrew results for input(s): CKTOTAL, CKMB, CKMBINDEX, TROPONINI in the last 168 hours. BNP (last 3 results) Andrew results for input(s): PROBNP in the last 8760 hours. HbA1C: Andrew results for input(s): HGBA1C in the last 72 hours. CBG: Andrew results for input(s): GLUCAP in the last 168 hours. Lipid Profile: Andrew results for input(s): CHOL, HDL, LDLCALC, TRIG, CHOLHDL, LDLDIRECT in the last 72 hours. Thyroid Function Tests: Andrew results for input(s): TSH, T4TOTAL, FREET4, T3FREE, THYROIDAB in the last 72 hours. Anemia Panel: Andrew results for input(s): VITAMINB12, FOLATE, FERRITIN, TIBC, IRON, RETICCTPCT in the last 72 hours. Sepsis Labs: Andrew results for input(s): PROCALCITON, LATICACIDVEN in the last 168 hours.  Recent Results (from the past 240 hour(s))  Respiratory Panel by RT PCR (Flu A&B, Covid) - Nasopharyngeal Swab     Status: None   Collection Time: 10/19/19  2:37 PM   Specimen: Nasopharyngeal Swab  Result Value Ref Range Status   SARS Coronavirus 2 by RT PCR NEGATIVE NEGATIVE Final    Comment: (NOTE) SARS-CoV-2 target nucleic  acids are NOT DETECTED.  The SARS-CoV-2 RNA is generally detectable in upper respiratoy specimens during the acute phase of infection. The lowest concentration of SARS-CoV-2 viral copies this assay can detect is 131 copies/mL. A negative result does not preclude SARS-Cov-2 infection and should not be used as the sole basis for treatment or other [name] management decisions. A negative result may occur with  improper specimen collection/handling, submission of specimen other than nasopharyngeal swab, presence of viral mutation(s) within the areas targeted by this assay, and inadequate number of viral copies (<131 copies/mL). A negative result must be combined with clinical observations, [name] history, and epidemiological information. The expected result is Negative.  Fact Sheet for Patients:  https://www.moore.com/  Fact Sheet for Healthcare Providers:  https://www.young.biz/  This test is Andrew t yet approved or cleared by the [place] FDA and  has been authorized for detection and/or diagnosis of SARS-CoV-2 by FDA under an Emergency Use Authorization (EUA). This EUA will remain  in effect (meaning this test can be used) for the duration  of the COVID-19 declaration under Section 564(b)(1) of the Act, 21 U.S.C. section 360bbb-3(b)(1), unless the authorization is terminated or revoked sooner.     Influenza A by PCR NEGATIVE NEGATIVE Final   Influenza B by PCR NEGATIVE NEGATIVE Final    Comment: (NOTE) The Xpert Xpress SARS-CoV-2/FLU/RSV assay is intended as an aid in  the diagnosis of influenza from Nasopharyngeal swab specimens and  should not be used as a sole basis for treatment. Nasal washings and  aspirates are unacceptable for Xpert Xpress SARS-CoV-2/FLU/RSV  testing.  Fact Sheet for Patients: https://www.moore.com/  Fact Sheet for Healthcare Providers: https://www.young.biz/  This test  is not yet approved or cleared by the [place] FDA and  has been authorized for detection and/or diagnosis of SARS-CoV-2 by  FDA under an Emergency Use Authorization (EUA). This EUA will remain  in effect (meaning this test can be used) for the duration of the  Covid-19 declaration under Section 564(b)(1) of the Act, 21  U.S.C. section 360bbb-3(b)(1), unless the authorization is  terminated or revoked. Performed at [facility], [address] [place], [place] [address]   Blood culture (routine x 2)     Status: None   Collection Time: 10/19/19  4:57 PM   Specimen: Left Antecubital; Blood  Result Value Ref Range Status   Specimen Description   Final    LEFT ANTECUBITAL Performed at [facility], [address] [place], [place] [address]    Special Requests   Final    BOTTLES DRAWN AEROBIC AND ANAEROBIC Blood Culture adequate volume Performed at [facility], [address] [place], [place] [address]    Culture   Final    Andrew GROWTH 5 DAYS Performed at [facility], [address], [place], [place] [address]    Report Status 10/24/2019 FINAL  Final  MRSA PCR Screening     Status: None   Collection Time: 10/19/19 10:58 PM   Specimen: Nasal Mucosa; Nasopharyngeal  Result Value Ref Range Status   MRSA by PCR NEGATIVE NEGATIVE Final    Comment:        The GeneXpert MRSA Assay (FDA approved for NASAL specimens only), is one component of a comprehensive MRSA colonization surveillance program. It is not intended to diagnose MRSA infection nor to guide or monitor treatment for MRSA infections. Performed at [facility], [address] [place], [place] [address]      Radiology Studies: Andrew results found.      Scheduled Meds: . folic acid  1 mg Oral Daily  . gabapentin  100 mg Oral TID  . mirtazapine  15 mg Oral QHS  . multivitamin with minerals  1 tablet Oral Daily  . nicotine  21 mg Transdermal Q2200  . sodium  chloride flush  3 mL Intravenous Q12H  . thiamine  100 mg Oral Daily  . warfarin  15 mg Oral ONCE-1600  . Warfarin - Pharmacist Dosing Inpatient   Does not apply q1600   Continuous Infusions: . sodium chloride 10 mL/hr at 10/19/19 1713  . heparin 2,300 Units/hr (10/28/19 1306)     LOS: 9 days     Briant Cedar, MD [facility]  If 7PM-7AM, please contact night-coverage [email or web address]  10/28/2019, 1:15 PM

## 2019-10-29 ENCOUNTER — Other Ambulatory Visit (HOSPITAL_COMMUNITY): Payer: Self-pay | Admitting: Internal Medicine

## 2019-10-29 LAB — CBC
HCT: 36.8 % — ABNORMAL LOW (ref 39.0–52.0)
Hemoglobin: 11.4 g/dL — ABNORMAL LOW (ref 13.0–17.0)
MCH: 27.3 pg (ref 26.0–34.0)
MCHC: 31 g/dL (ref 30.0–36.0)
MCV: 88.2 fL (ref 80.0–100.0)
Platelets: 598 10*3/uL — ABNORMAL HIGH (ref 150–400)
RBC: 4.17 MIL/uL — ABNORMAL LOW (ref 4.22–5.81)
RDW: 13 % (ref 11.5–15.5)
WBC: 9.3 10*3/uL (ref 4.0–10.5)
nRBC: 0 % (ref 0.0–0.2)

## 2019-10-29 LAB — HEPARIN LEVEL (UNFRACTIONATED): Heparin Unfractionated: 0.78 IU/mL — ABNORMAL HIGH (ref 0.30–0.70)

## 2019-10-29 LAB — PROTIME-INR
INR: 2.5 — ABNORMAL HIGH (ref 0.8–1.2)
Prothrombin Time: 26 seconds — ABNORMAL HIGH (ref 11.4–15.2)

## 2019-10-29 MED ORDER — GABAPENTIN 100 MG PO CAPS: 100.0000 mg | ORAL_CAPSULE | Freq: Three times a day (TID) | ORAL | 0 refills | Status: DC

## 2019-10-29 MED ORDER — WARFARIN SODIUM 10 MG PO TABS: 10.0000 mg | ORAL_TABLET | Freq: Every day | ORAL | 0 refills | Status: DC

## 2019-10-29 MED ORDER — MIRTAZAPINE 15 MG PO TBDP: 15.0000 mg | ORAL_TABLET | Freq: Every day | ORAL | 0 refills | Status: AC

## 2019-10-29 MED FILL — WARFARIN SODIUM 5 MG TABLET: 5 | 30 days supply | Qty: 60 | Fill #0

## 2019-10-29 MED FILL — GABAPENTIN 100 MG CAPSULE: 100 | 30 days supply | Qty: 90 | Fill #0

## 2019-10-29 MED FILL — MIRTAZAPINE 15 MG TABLET: 15 | 30 days supply | Qty: 30 | Fill #0

## 2019-10-29 NOTE — Progress Notes (Signed)
ANTICOAGULATION CONSULT NOTE - Follow Up Consult  Pharmacy Consult for heparin Indication: pulmonary embolus  Labs: Recent Labs    10/27/19 0104 10/27/19 0104 10/28/19 0123 10/29/19 0423  HGB 11.5*   < > 11.3* 11.4*  HCT 37.2*  --  36.0* 36.8*  PLT 534*  --  566* 598*  LABPROT 20.2*  --  23.6* 26.0*  INR 1.8*  --  2.2* 2.5*  HEPARINUNFRC 0.42  --  0.64 0.78*   < > = values in this interval not displayed.    Assessment: 29yo male supratherapeutic on heparin after several levels at goal; no gtt issues or signs of bleeding per RN.  Goal of Therapy:  Heparin level 0.3-0.7 units/ml   Plan:  Will decrease heparin gtt by 1 units/kg/hr to 2200 units/hr and check level in 6 hours.    Vernard Gambles, PharmD, BCPS  10/29/2019,5:24 AM

## 2019-10-29 NOTE — Discharge Summary (Signed)
Discharge Summary  Andrew [name] SWF:[id number] DOB: [birth year]  PCP: Patient, No Pcp Per  Admit date: 10/19/2019 Discharge date: 10/29/2019  Time spent: 40 mins  Recommendations for Outpatient Follow-up:  1. PCP in 1 week as scheduled 2. [facility] clinic for INR checks as scheduled   Discharge Diagnoses:  Active Hospital Problems   Diagnosis Date Noted  . Bilateral pulmonary embolism (HCC) 10/19/2019  . History of rhabdomyolysis 10/19/2019  . Multifocal pneumonia 10/19/2019  . Polysubstance abuse (HCC) 08/21/2019    Resolved Hospital Problems  No resolved problems to display.    Discharge Condition: Stable  Diet recommendation: As tolerated  Vitals:   10/28/19 2133 10/29/19 0552  BP: 131/75 121/68  Pulse: 83 80  Resp: 20 20  Temp: 98.5 F (36.9 C) 98 F (36.7 C)  SpO2: 98% 96%    History of present illness:  Andrew Montrel Staffordis a 29 y.o.malewith medical history significant ofdepression, schizophrenia, back pain, polysubstance use (heroin, marijuana, tobacco, alcohol), and hospitalization from August 10 to August 15 for rhabdo my lysis and compartment syndrome (did not require surgical intervention) who presented with 3 days of productive cough and worsening dyspnea.As above, symptoms were present for about 3 days. He states he has had associated intermittent trace bloody sputum if he has a deep cough and he experiences pleuritic chest pain whenever he inhales deeply. He also endorses a decreased appetite. He denies nausea, vomiting, fever, abdominal pain, constipation, diarrhea. In ED: D-dimer 2.99; CTA PE study showed bilateral PE involving segmental branches of the lower lobes with slight right ventricular enlargement which may represent a degree of strain; CTA also redemonstrated multifocal pneumonia possibly atypical in etiology.     Today, pt denies any new complaints. Very eager to be discharged. Advised to be compliant with medications and follow  up appointments, verbalized understanding.     Hospital Course:  Principal Problem:   Bilateral pulmonary embolism (HCC) Active Problems:   Polysubstance abuse (HCC)   History of rhabdomyolysis   Multifocal pneumonia   Acute hypoxic respiratory failure in the setting of bilateral pulmonary embolism: CTA positive for bilateral filling defects  Echocardiogram unremarkable; DVT study negative bilaterally              Discharged on Coumadin, advised to follow up for INR checks Recent Labs[] Expand by Default       Lab Results  Component Value Date   INR 2.2 (H) 10/28/2019   INR 1.8 (H) 10/27/2019   INR 1.7 (H) 10/26/2019      Questionable multifocal pneumonia, POA: Patient does not meet sepsis criteria, mild leukocytosis initially but without fever tachycardia or tachypnea Completed ceftriaxone and azithromycin  Polysubstance use: Reports snorting heroin 3 days prior to admission-  denies injecting any medications Also admits to marijuana alcohol and tobacco use/abuse Advised to quit  History of schizophrenia Continue mirtazapine  History of rhabdomyolysis with residual nerve pain Continue gabapentin       Malnutrition Type:      Malnutrition Characteristics:      Nutrition Interventions:      Estimated body mass index is 34.15 kg/m as calculated from the following:   Height as of this encounter: 6' (1.829 m).   Weight as of this encounter: 114.2 kg.    Procedures:  None  Consultations:  None  Discharge Exam: BP 121/68 (BP Location: Left Arm)   Pulse 80   Temp 98 F (36.7 C) (Oral)   Resp 20   Ht 6' (1.829 m)  Wt 114.2 kg   SpO2 96%   BMI 34.15 kg/m   General: NAD Cardiovascular: S1, S2 present  Respiratory: CTAB    Discharge Instructions You were cared for by a hospitalist during your hospital stay. If you have any questions about your discharge medications or the care you received while you were in the hospital after  you are discharged, you can call the unit and asked to speak with the hospitalist on call if the hospitalist that took care of you is not available. Once you are discharged, your primary care physician will handle any further medical issues. Please note that NO REFILLS for any discharge medications will be authorized once you are discharged, as it is imperative that you return to your primary care physician (or establish a relationship with a primary care physician if you do not have one) for your aftercare needs so that they can reassess your need for medications and monitor your lab values.  Discharge Instructions    Diet - low sodium heart healthy   Complete by: As directed    Increase activity slowly   Complete by: As directed      Allergies as of 10/29/2019      Reactions   Haloperidol Lactate Other (See Comments)   Penicillins Hives      Medication List    STOP taking these medications   folic acid 1 MG tablet Commonly known as: FOLVITE   multivitamin with minerals Tabs tablet   oxyCODONE 5 MG immediate release tablet Commonly known as: Roxicodone   thiamine 100 MG tablet     TAKE these medications   gabapentin 100 MG capsule Commonly known as: Neurontin Take 1 capsule (100 mg total) by mouth 3 (three) times daily.   mirtazapine 15 MG disintegrating tablet Commonly known as: REMERON SOL-TAB Take 1 tablet (15 mg total) by mouth at bedtime. What changed:   when to take this  reasons to take this   warfarin 10 MG tablet Commonly known as: Coumadin Take 1 tablet (10 mg total) by mouth daily.      Allergies  Allergen Reactions  . Haloperidol Lactate Other (See Comments)  . Penicillins Hives    Follow-up Information    [facility]. Go on 11/05/2019.   Why: Please attend your eligibility appt on Monday, 11/05/19, at 9AM.  Please bring a photo ID, and documentation related to any income. Contact information: [address] [place], [place]  [address] P: [phone]       [facility]. Go on 10/31/2019.   Why: Please attend your appointment with Dr Dulce Sellar on Wednesday, 10/31/19, at 1:40pm.  Please make sure to ask about your next appointment at the coumadin clinic afterwards. Contact information: [address] [place], [place] [address] P: [phone]               The results of significant diagnostics from this hospitalization (including imaging, microbiology, ancillary and laboratory) are listed below for reference.    Significant Diagnostic Studies: CT Angio Chest PE W and/or Wo Contrast  Result Date: 10/19/2019 CLINICAL DATA:  29 year old male with concern for pulmonary embolism. EXAM: CT ANGIOGRAPHY CHEST WITH CONTRAST TECHNIQUE: Multidetector CT imaging of the chest was performed using the standard protocol during bolus administration of intravenous contrast. Multiplanar CT image reconstructions and MIPs were obtained to evaluate the vascular anatomy. CONTRAST:  OMNIPAQUE IOHEXOL 350 MG/ML SOLN COMPARISON:  Chest radiograph dated 10/19/2019. FINDINGS: Cardiovascular:  There is no cardiomegaly or pericardial effusion. There is mild dilatation of the right ventricle. The right ventricular lumen measures approximately 4.9 cm in diameter and the left ventricular lumen measures 4.1 cm with a RV/LV ratio of 1.2. The thoracic aorta is unremarkable. The origins of the great vessels of the aortic arch appear patent. Mild dilatation of main pulmonary trunk may be related to a degree of hypertension. There are bilateral pulmonary artery emboli primarily involving the segmental branches of the lower lobes. Mediastinum/Nodes: Right hilar adenopathy measuring 14 mm in short axis. Mildly enlarged subcarinal lymph nodes measure up to 15 mm. The esophagus and the thyroid gland are grossly unremarkable. No mediastinal fluid collection. Lungs/Pleura: Bilateral nodular and ground-glass opacities most  consistent with multifocal pneumonia, likely atypical in etiology and may represent fungal infection, or other atypical agent. Clinical correlation is recommended. There is a small cavitary nodule in the left upper lobe (134/6). Septic emboli are not excluded. Upper Abdomen: No acute abnormality. Musculoskeletal: No chest wall abnormality. No acute or significant osseous findings. Review of the MIP images confirms the above findings. IMPRESSION: 1. Bilateral pulmonary artery emboli primarily involving segmental branches of the lower lobes. There is slight enlargement the right ventricle which may represent a degree of straining. Clinical correlation is recommended. 2. Multifocal pneumonia, possibly fungal or atypical in etiology. These results were called by telephone at the time of interpretation on 10/19/2019 at 4:27 pm to Dr Tilden FossaElizabeth Rees, who verbally acknowledged these results. Electronically Signed   By: Elgie CollardArash  Radparvar M.D.   On: 10/19/2019 16:36   DG Chest Portable 1 View  Result Date: 10/19/2019 CLINICAL DATA:  Cough and shortness of breath. EXAM: PORTABLE CHEST 1 VIEW COMPARISON:  Chest x-ray dated February 04, 2010. FINDINGS: The heart size and mediastinal contours are within normal limits. Normal pulmonary vascularity. Patchy consolidation in the right greater than left upper lobes and right lung base. No pleural effusion or pneumothorax. No acute osseous abnormality. IMPRESSION: 1. Multifocal pneumonia. Electronically Signed   By: Obie DredgeWilliam T Derry M.D.   On: 10/19/2019 13:55   ECHOCARDIOGRAM COMPLETE  Result Date: 10/20/2019    ECHOCARDIOGRAM REPORT   Patient Name:   Andrew [name] Date of Exam: 10/20/2019 Medical Rec #:  [id number]               Height:       72.0 in Accession #:    [id number]              Weight:       251.8 lb Date of Birth:  04/06/1990                BSA:          2.350 m Patient Age:    29 years                BP:           128/68 mmHg Patient Gender: M                        HR:           81 bpm. Exam Location:  Inpatient Procedure: 2D Echo, 3D Echo and Strain Analysis Indications:    Pulmonary Embolus 415.19 / I26.99  History:        Patient has prior history of Echocardiogram examinations, most                 recent 08/22/2019.  Risk Factors:Current Smoker. Polysubstance                 use, Multifocal pneumonia, History of rhabdomyolysis with                 residual nerve pain.  Sonographer:    Leta Jungling RDCS Referring Phys: [id number] Cecille Po MELVIN IMPRESSIONS  1. Left ventricular ejection fraction, by estimation, is 50 to 55%. The left ventricle has low normal function. The left ventricle has no regional wall motion abnormalities. Left ventricular diastolic parameters were normal.  2. Right ventricular systolic function is low normal. The right ventricular size is normal. Tricuspid regurgitation signal is inadequate for assessing PA pressure.  3. The mitral valve is normal in structure. No evidence of mitral valve regurgitation.  4. The aortic valve is normal in structure. Aortic valve regurgitation is not visualized. No aortic stenosis is present.  5. PV Acceleration time mildly increase, suggestive of mild elevation in PAP.  6. The inferior vena cava is normal in size with greater than 50% respiratory variability, suggesting right atrial pressure of 3 mmHg. Comparison(s): Subtle decrease in LV and RV function from 8/11 study. FINDINGS  Left Ventricle: Left ventricular ejection fraction, by estimation, is 50 to 55%. The left ventricle has low normal function. The left ventricle has no regional wall motion abnormalities. The left ventricular internal cavity size was normal in size. There is no left ventricular hypertrophy. Left ventricular diastolic parameters were normal. Right Ventricle: The right ventricular size is normal. No increase in right ventricular wall thickness. Right ventricular systolic function is low normal. Tricuspid regurgitation signal is inadequate  for assessing PA pressure. Left Atrium: Left atrial size was normal in size. Right Atrium: Right atrial size was normal in size. Pericardium: There is no evidence of pericardial effusion. Mitral Valve: The mitral valve is normal in structure. No evidence of mitral valve regurgitation. Tricuspid Valve: The tricuspid valve is normal in structure. Tricuspid valve regurgitation is trivial. Aortic Valve: The aortic valve is normal in structure. Aortic valve regurgitation is not visualized. No aortic stenosis is present. Pulmonic Valve: PV Acceleration time mildly increase, suggestive of mild elevation in PAP. The pulmonic valve was not well visualized. Pulmonic valve regurgitation is not visualized. Aorta: The aortic root and ascending aorta are structurally normal, with no evidence of dilitation. Venous: The inferior vena cava is normal in size with greater than 50% respiratory variability, suggesting right atrial pressure of 3 mmHg. IAS/Shunts: The atrial septum is grossly normal.  LEFT VENTRICLE PLAX 2D LVIDd:         5.10 cm  Diastology LVIDs:         3.60 cm  LV e' medial:    9.65 cm/s LV PW:         0.90 cm  LV E/e' medial:  6.2 LV IVS:        1.00 cm  LV e' lateral:   12.70 cm/s LVOT diam:     2.00 cm  LV E/e' lateral: 4.7 LV SV:         48 LV SV Index:   20 LVOT Area:     3.14 cm                          3D Volume EF:                         3D EF:  52 % RIGHT VENTRICLE TAPSE (M-mode): 1.6 cm LEFT ATRIUM           Index LA diam:      3.50 cm 1.49 cm/m LA Vol (A2C): 36.1 ml 15.36 ml/m LA Vol (A4C): 33.8 ml 14.39 ml/m  AORTIC VALVE LVOT Vmax:   103.00 cm/s LVOT Vmean:  70.300 cm/s LVOT VTI:    0.153 m  AORTA Ao Root diam: 3.20 cm MITRAL VALVE MV Area (PHT): 3.10 cm    SHUNTS MV Decel Time: 245 msec    Systemic VTI:  0.15 m MV E velocity: 60.00 cm/s  Systemic Diam: 2.00 cm MV A velocity: 38.60 cm/s MV E/A ratio:  1.55 Riley Lam MD Electronically signed by Riley Lam MD Signature  Date/Time: 10/20/2019/1:52:58 PM    Final    VAS [place] LOWER EXTREMITY VENOUS (DVT)  Result Date: 10/21/2019  Lower Venous DVTStudy Indications: Pulmonary embolism.  Comparison Study: No prior study Performing Technologist: Gertie Fey MHA, RDMS, RVT, RDCS  Examination Guidelines: A complete evaluation includes B-mode imaging, spectral Doppler, color Doppler, and power Doppler as needed of all accessible portions of each vessel. Bilateral testing is considered an integral part of a complete examination. Limited examinations for reoccurring indications may be performed as noted. The reflux portion of the exam is performed with the patient in reverse Trendelenburg.  +---------+---------------+---------+-----------+----------+--------------+ RIGHT    CompressibilityPhasicitySpontaneityPropertiesThrombus Aging +---------+---------------+---------+-----------+----------+--------------+ CFV      Full           Yes      Yes                                 +---------+---------------+---------+-----------+----------+--------------+ SFJ      Full                                                        +---------+---------------+---------+-----------+----------+--------------+ FV Prox  Full                                                        +---------+---------------+---------+-----------+----------+--------------+ FV Mid   Full                                                        +---------+---------------+---------+-----------+----------+--------------+ FV DistalFull                                                        +---------+---------------+---------+-----------+----------+--------------+ PFV      Full                                                        +---------+---------------+---------+-----------+----------+--------------+  POP      Full           Yes      Yes                                  +---------+---------------+---------+-----------+----------+--------------+ PTV      Full                                                        +---------+---------------+---------+-----------+----------+--------------+ PERO     Full                                                        +---------+---------------+---------+-----------+----------+--------------+   +---------+---------------+---------+-----------+----------+--------------+ LEFT     CompressibilityPhasicitySpontaneityPropertiesThrombus Aging +---------+---------------+---------+-----------+----------+--------------+ CFV      Full           Yes      Yes                                 +---------+---------------+---------+-----------+----------+--------------+ SFJ      Full                                                        +---------+---------------+---------+-----------+----------+--------------+ FV Prox  Full                                                        +---------+---------------+---------+-----------+----------+--------------+ FV Mid   Full                                                        +---------+---------------+---------+-----------+----------+--------------+ FV DistalFull                                                        +---------+---------------+---------+-----------+----------+--------------+ PFV      Full                                                        +---------+---------------+---------+-----------+----------+--------------+ POP      Full           Yes      Yes                                 +---------+---------------+---------+-----------+----------+--------------+  PTV      Full                                                        +---------+---------------+---------+-----------+----------+--------------+ PERO     Full                                                         +---------+---------------+---------+-----------+----------+--------------+     Summary: RIGHT: - There is no evidence of deep vein thrombosis in the lower extremity.  - No cystic structure found in the popliteal fossa.  LEFT: - There is no evidence of deep vein thrombosis in the lower extremity.  - No cystic structure found in the popliteal fossa.  *See table(s) above for measurements and observations. Electronically signed by Lemar Livings MD on 10/21/2019 at 11:32:52 AM.    Final     Microbiology: Recent Results (from the past 240 hour(s))  Respiratory Panel by RT PCR (Flu A&B, Covid) - Nasopharyngeal Swab     Status: None   Collection Time: 10/19/19  2:37 PM   Specimen: Nasopharyngeal Swab  Result Value Ref Range Status   SARS Coronavirus 2 by RT PCR NEGATIVE NEGATIVE Final    Comment: (NOTE) SARS-CoV-2 target nucleic acids are NOT DETECTED.  The SARS-CoV-2 RNA is generally detectable in upper respiratoy specimens during the acute phase of infection. The lowest concentration of SARS-CoV-2 viral copies this assay can detect is 131 copies/mL. A negative result does not preclude SARS-Cov-2 infection and should not be used as the sole basis for treatment or other patient management decisions. A negative result may occur with  improper specimen collection/handling, submission of specimen other than nasopharyngeal swab, presence of viral mutation(s) within the areas targeted by this assay, and inadequate number of viral copies (<131 copies/mL). A negative result must be combined with clinical observations, patient history, and epidemiological information. The expected result is Negative.  Fact Sheet for Patients:  https://www.moore.com/  Fact Sheet for Healthcare Providers:  https://www.young.biz/  This test is no t yet approved or cleared by the [place] FDA and  has been authorized for detection and/or diagnosis of SARS-CoV-2 by FDA under an  Emergency Use Authorization (EUA). This EUA will remain  in effect (meaning this test can be used) for the duration of the COVID-19 declaration under Section 564(b)(1) of the Act, 21 U.S.C. section 360bbb-3(b)(1), unless the authorization is terminated or revoked sooner.     Influenza A by PCR NEGATIVE NEGATIVE Final   Influenza B by PCR NEGATIVE NEGATIVE Final    Comment: (NOTE) The Xpert Xpress SARS-CoV-2/FLU/RSV assay is intended as an aid in  the diagnosis of influenza from Nasopharyngeal swab specimens and  should not be used as a sole basis for treatment. Nasal washings and  aspirates are unacceptable for Xpert Xpress SARS-CoV-2/FLU/RSV  testing.  Fact Sheet for Patients: https://www.moore.com/  Fact Sheet for Healthcare Providers: https://www.young.biz/  This test is not yet approved or cleared by the [place] FDA and  has been authorized for detection and/or diagnosis of SARS-CoV-2 by  FDA under an Emergency Use Authorization (EUA). This EUA will remain  in effect (meaning this test  can be used) for the duration of the  Covid-19 declaration under Section 564(b)(1) of the Act, 21  U.S.C. section 360bbb-3(b)(1), unless the authorization is  terminated or revoked. Performed at [facility], [address] [place], [place] [address]   Blood culture (routine x 2)     Status: None   Collection Time: 10/19/19  4:57 PM   Specimen: Left Antecubital; Blood  Result Value Ref Range Status   Specimen Description   Final    LEFT ANTECUBITAL Performed at [facility], [address] [place], [place] [address]    Special Requests   Final    BOTTLES DRAWN AEROBIC AND ANAEROBIC Blood Culture adequate volume Performed at [facility], [address] [place], [place] [address]    Culture   Final    NO GROWTH 5 DAYS Performed at [facility], [address], [place], [place] [address]    Report  Status 10/24/2019 FINAL  Final  MRSA PCR Screening     Status: None   Collection Time: 10/19/19 10:58 PM   Specimen: Nasal Mucosa; Nasopharyngeal  Result Value Ref Range Status   MRSA by PCR NEGATIVE NEGATIVE Final    Comment:        The GeneXpert MRSA Assay (FDA approved for NASAL specimens only), is one component of a comprehensive MRSA colonization surveillance program. It is not intended to diagnose MRSA infection nor to guide or monitor treatment for MRSA infections. Performed at [facility], [address], [place], [place] [address]      Labs: Basic Metabolic Panel: Recent Labs  Lab 10/26/19 0201  NA 137  K 4.1  CL 102  CO2 25  GLUCOSE 90  BUN 13  CREATININE 0.90  CALCIUM 9.0   Liver Function Tests: No results for input(s): AST, ALT, ALKPHOS, BILITOT, PROT, ALBUMIN in the last 168 hours. No results for input(s): LIPASE, AMYLASE in the last 168 hours. No results for input(s): AMMONIA in the last 168 hours. CBC: Recent Labs  Lab 10/25/19 0314 10/26/19 0201 10/27/19 0104 10/28/19 0123 10/29/19 0423  WBC 7.7 8.0 7.4 8.9 9.3  HGB 10.3* 11.5* 11.5* 11.3* 11.4*  HCT 32.7* 36.7* 37.2* 36.0* 36.8*  MCV 86.1 86.8 86.1 86.5 88.2  PLT 374 478* 534* 566* 598*   Cardiac Enzymes: No results for input(s): CKTOTAL, CKMB, CKMBINDEX, TROPONINI in the last 168 hours. BNP: BNP (last 3 results) No results for input(s): BNP in the last 8760 hours.  ProBNP (last 3 results) No results for input(s): PROBNP in the last 8760 hours.  CBG: No results for input(s): GLUCAP in the last 168 hours.     Signed:  Briant Cedar, MD [facility] 10/29/2019, 1:23 PM

## 2019-10-29 NOTE — Care Management (Signed)
Was asked to enter patient into [facility] program with no co pays. Same done. [facility] will bring medications to hospital room.   Ronny Flurry RN

## 2019-10-29 NOTE — Progress Notes (Signed)
ANTICOAGULATION CONSULT NOTE  Pharmacy Consult:  Heparin / warfarin Indication: pulmonary embolus  Allergies  Allergen Reactions  . Haloperidol Lactate Other (See Comments)  . Penicillins Hives   Patient Measurements: Height: 6' (182.9 cm) Weight: 114.2 kg (251 lb 12.3 oz) IBW/kg (Calculated) : 77.6 Heparin Dosing Weight: 102.2kg  Vital Signs: Temp: 98 F (36.7 C) (10/18 0552) Temp Source: Oral (10/18 0552) BP: 121/68 (10/18 0552) Pulse Rate: 80 (10/18 0552)  Labs: Recent Labs    10/27/19 0104 10/27/19 0104 10/28/19 0123 10/29/19 0423  HGB 11.5*   < > 11.3* 11.4*  HCT 37.2*  --  36.0* 36.8*  PLT 534*  --  566* 598*  LABPROT 20.2*  --  23.6* 26.0*  INR 1.8*  --  2.2* 2.5*  HEPARINUNFRC 0.42  --  0.64 0.78*   < > = values in this interval not displayed.    Estimated Creatinine Clearance: 157.9 mL/min (by C-G formula based on SCr of 0.9 mg/dL).   Assessment: 29 year old male presenting with cough and pleuritic chest pain found to have bilateral PE with evidence of mild R heart strain.  Pharmacy also consulted to bridge IV heparin to warfarin. Pt has no insurance.  Heparin level was slightly supra-therapeutic this AM and infusion rate was decreased to 2200 units/hr. INR is therapeutic for 2 days - can stop IV Heparin. INR today is up to 2.5 - a good rise after 4 days of 15mg  daily- like needs reduced dose going forward. Patient likely to discharge home today. H/H is low stable for last 3 days. Platelets elevated. No bleeding reported.   Goal of Therapy:  INR 2-3 Heparin level 0.3-0.7 units/ml Monitor platelets by anticoagulation protocol: Yes   Plan:  Discontinue IV Heparin - INR >2 for 2 days. Confirmed with Dr. .  Warfarin 10mg  po x1 tonight - consider home dose of 10mg  daily with INR check in 2-3 days.  Daily heparin level, PT/INR and CBC  Sharolyn Douglas, PharmD, BCPS, BCCCP Clinical Pharmacist Please refer to [facility] for [facility]  numbers 10/29/2019, 10:55 AM

## 2019-10-30 DIAGNOSIS — G8929 Other chronic pain: Secondary | ICD-10-CM | POA: Insufficient documentation

## 2019-10-30 DIAGNOSIS — M549 Dorsalgia, unspecified: Secondary | ICD-10-CM | POA: Insufficient documentation

## 2019-10-30 DIAGNOSIS — F32A Depression, unspecified: Secondary | ICD-10-CM | POA: Insufficient documentation

## 2019-10-30 NOTE — Progress Notes (Deleted)
Cardiology Office Note:    Date:  10/30/2019   ID:  Andrew [name], DOB [birth year], MRN [id number]  PCP:  Patient, No Pcp Per  Cardiologist:  Norman Herrlich, MD   Referring MD: Briant Cedar, MD  ASSESSMENT:    No diagnosis found. PLAN:    In order of problems listed above:  1. ***  Next appointment   Medication Adjustments/Labs and Tests Ordered: Current medicines are reviewed at length with the patient today.  Concerns regarding medicines are outlined above.  No orders of the defined types were placed in this encounter.  No orders of the defined types were placed in this encounter.    No chief complaint on file. ***  History of Present Illness:    Andrew [name] is a 29 y.o. male who is being seen today for pulmonary embolism without identifiable reversible risk factor after recent [facility] cessation admitted 10/19/2019 at the request of [name], [name], MD.  He was treated with heparin with  transition to warfarin ,other problems include a multifocal pneumonia rhabdomyolysis polysubstance abuse.  An echocardiogram performed as an inpatient [facility] 10/19/2019 independently reviewed left ventricular ejection fraction low normal 50 to 55% with normal diastolic function right ventricular size was normal with a low normal ejection fraction no evidence of right ventricular pressure overload.  EKG same date independently reviewed sinus rhythm borderline QTC prolongation otherwise normal with no evidence of right heart strain right atrial enlargement.    Ref Range & Units 1 d ago 2 d ago 3 d ago  Prothrombin Time 11.4 - 15.2 seconds 26.0High  23.6High  20.2High   INR 0.8 - 1.2 2.5High  2.2High CM  1.8High   D-dimer 10/19/2019 severely elevated 2.99 High-sensitivity troponin elevators 75 Lactic acid normal 1.8 HIV antibody nonreactive  Chest x-ray 10/19/2019 independently reviewed Bilateral patchy infiltrates read  as multifocal pneumonia CTA pulmonary embolism protocol 10/19/2019 shows bilateral pulmonary emboli involving segmental branches of the lower lobes with mild enlargement of the right ventricle and findings of multifocal pneumonia Past Medical History:  Diagnosis Date  . Bilateral pulmonary embolism (HCC) 10/19/2019  . Cannabis use disorder, moderate, dependence (HCC) 06/07/2018  . Chronic back pain   . Cold left foot 08/21/2019  . Depression   . History of rhabdomyolysis 10/19/2019  . Left leg weakness 08/21/2019  . Multifocal pneumonia 10/19/2019  . Polysubstance abuse (HCC) 08/21/2019  . Rhabdomyolysis 08/21/2019  . Schizophrenia (HCC) 06/07/2018    No past surgical history on file.  Current Medications: No outpatient medications have been marked as taking for the 10/31/19 encounter (Appointment) with Baldo Daub, MD.     Allergies:   Haloperidol lactate and Penicillins   Social History   Socioeconomic History  . Marital status: Single    Spouse name: Not on file  . Number of children: Not on file  . Years of education: Not on file  . Highest education level: Not on file  Occupational History  . Not on file  Tobacco Use  . Smoking status: Current Every Day Smoker    Packs/day: 1.00    Types: Cigarettes  . Smokeless tobacco: Never Used  Vaping Use  . Vaping Use: Never used  Substance and Sexual Activity  . Alcohol use: Yes    Comment: occassional  . Drug use: Yes    Types: Marijuana, Cocaine    Comment: occassional  . Sexual activity: Yes  Other Topics Concern  . Not on file  Social History Narrative   ** Merged History Encounter **       Social Determinants of Health   Financial Resource Strain:   . Difficulty of Paying Living Expenses: Not on file  Food Insecurity:   . Worried About Programme researcher, broadcasting/film/video in the Last Year: Not on file  . Ran Out of Food in the Last Year: Not on file  Transportation Needs:   . Lack of Transportation (Medical): Not on file  .  Lack of Transportation (Non-Medical): Not on file  Physical Activity:   . Days of Exercise per Week: Not on file  . Minutes of Exercise per Session: Not on file  Stress:   . Feeling of Stress : Not on file  Social Connections:   . Frequency of Communication with Friends and Family: Not on file  . Frequency of Social Gatherings with Friends and Family: Not on file  . Attends Religious Services: Not on file  . Active Member of Clubs or Organizations: Not on file  . Attends Banker Meetings: Not on file  . Marital Status: Not on file     Family History: The patient's ***Family history is unknown by patient.  ROS:   ROS Please see the history of present illness.    *** All other systems reviewed and are negative.  EKGs/Labs/Other Studies Reviewed:    The following studies were reviewed today: ***  EKG:  EKG is *** ordered today.  The ekg ordered today is personally reviewed and demonstrates ***  Recent Labs: 08/22/2019: Magnesium 2.0; TSH 0.336 10/20/2019: ALT 23 10/26/2019: BUN 13; Creatinine, Ser 0.90; Potassium 4.1; Sodium 137 10/29/2019: Hemoglobin 11.4; Platelets 598  Recent Lipid Panel No results found for: CHOL, TRIG, HDL, CHOLHDL, VLDL, LDLCALC, LDLDIRECT  Physical Exam:    VS:  There were no vitals taken for this visit.    Wt Readings from Last 3 Encounters:  10/19/19 251 lb 12.3 oz (114.2 kg)  09/27/19 291 lb (132 kg)  08/24/19 291 lb 14.2 oz (132.4 kg)     GEN: *** Well nourished, well developed in no acute distress HEENT: Normal NECK: No JVD; No carotid bruits LYMPHATICS: No lymphadenopathy CARDIAC: ***RRR, no murmurs, rubs, gallops RESPIRATORY:  Clear to auscultation without rales, wheezing or rhonchi  ABDOMEN: Soft, non-tender, non-distended MUSCULOSKELETAL:  No edema; No deformity  SKIN: Warm and dry NEUROLOGIC:  Alert and oriented x 3 PSYCHIATRIC:  Normal affect     Signed, Norman Herrlich, MD  10/30/2019 4:46 PM    [address]  [facility]

## 2019-10-31 ENCOUNTER — Telehealth: Payer: Self-pay

## 2019-10-31 ENCOUNTER — Other Ambulatory Visit: Payer: Self-pay

## 2019-10-31 ENCOUNTER — Ambulatory Visit (INDEPENDENT_AMBULATORY_CARE_PROVIDER_SITE_OTHER): Payer: Self-pay | Admitting: Cardiology

## 2019-10-31 ENCOUNTER — Encounter: Payer: Self-pay | Admitting: Cardiology

## 2019-10-31 ENCOUNTER — Ambulatory Visit: Payer: Self-pay | Admitting: Cardiology

## 2019-10-31 VITALS — BP 124/70 | HR 86 | Ht 72.0 in | Wt 252.1 lb

## 2019-10-31 DIAGNOSIS — Z7901 Long term (current) use of anticoagulants: Secondary | ICD-10-CM

## 2019-10-31 DIAGNOSIS — I2699 Other pulmonary embolism without acute cor pulmonale: Secondary | ICD-10-CM

## 2019-10-31 NOTE — Telephone Encounter (Signed)
Unable to lmom for NEW COUMADIN appt started warfarin 10/20

## 2019-10-31 NOTE — Progress Notes (Signed)
Cardiology Office Note:    Date:  10/31/2019   ID:  Andrew [name], DOB 1990-03-29, MRN [id number]  PCP:  Patient, No Pcp Per  Cardiologist:  Norman Herrlich, MD    Referring MD: Briant Cedar, MD    ASSESSMENT:    1. Bilateral pulmonary embolism (HCC)   2. Chronic anticoagulation    PLAN:    In order of problems listed above:  1. Recent hospitalization pulmonary embolism no cardiac complications no underlying heart disease referred here for management of anticoagulation.  Our practice is referred to anticoagulation clinic: With a target INR 2.5-3 and on warfarin due to cost and lack of benefits fortunately liver function normalized soon after hospitalization August he has had no bleeding complications.  In my opinion this was a provoked embolus following rhabdomyolysis or compartment syndrome left lower extremity.  I will see her back in 6 months to discontinue coagulation in order evaluation for hypercoagulable state that would prompt ongoing anticoagulation.   Next appointment: 6 months   Medication Adjustments/Labs and Tests Ordered: Current medicines are reviewed at length with the patient today.  Concerns regarding medicines are outlined above.  Orders Placed This Encounter  Procedures  . INR/PT  . Ambulatory referral to Anticoagulation Monitoring   No orders of the defined types were placed in this encounter.   Chief Complaint  Patient presents with  . Follow-up    For hospitalization with pulmonary embolism placed warfarin.  . Anticoagulation    History of Present Illness:    Andrew [name] is a 29 y.o. male who is being seen today for pulmonary embolism without identifiable reversible risk factor after recent [facility] cessation admitted 10/19/2019 at the request of [name], [name], MD.  He was treated with heparin with  transition to warfarin ,other problems include a multifocal pneumonia rhabdomyolysis and polysubstance  abuse.  He was not seen by cardiology during his hospital stay. He was admitted to the hospital in August when he had pain and swelling the left lower extremity secondary to rhabdomyolysis with subacute compartment syndrome left lower extremity secondary narcotic overdose with pressure ischemia.  He did not require surgical intervention.  At the time left lower extremity venous duplex was performed showing no findings of DVT.  During that admission his CK exceeded 50,000.  He subsequently presented to [facility] with complaints of pain in the left lower extremity and the left without evaluation.  This was on 09/05/2019 in lab test showed normal CPK 178 liver function tests were normal at that time  An echocardiogram performed as an inpatient [facility] 10/19/2019 independently reviewed left ventricular ejection fraction low normal 50 to 55% with normal diastolic function right ventricular size was normal with a low normal ejection fraction no evidence of right ventricular pressure overload.  EKG same date independently reviewed sinus rhythm borderline QTC prolongation otherwise normal with no evidence of right heart strain right atrial enlargement.     Ref Range & Units 1 d ago 2 d ago 3 d ago  Prothrombin Time 11.4 - 15.2 seconds 26.0 High   23.6 High   20.2 High    INR 0.8 - 1.2 2.5 High   2.2 High  CM  1.8 High     D-dimer 10/19/2019 severely elevated 2.99 High-sensitivity troponin elevated 75 Lactic acid normal 1.8 HIV antibody nonreactive   Chest x-ray 10/19/2019 independently reviewed Bilateral patchy infiltrates read as multifocal pneumonia CTA pulmonary embolism protocol 10/19/2019 shows  bilateral pulmonary emboli involving segmental branches of the lower lobes with mild enlargement of the right ventricle and findings of multifocal pneumonia  In August he had quite abnormal liver function tests as an inpatient.  2 mo ago  (08/23/19) 2 mo ago  (08/22/19) 2 mo ago   (08/22/19)   Sodium 135 - 145 mmol/L 135  135    Potassium 3.5 - 5.1 mmol/L 3.6  3.3Low    Chloride 98 - 111 mmol/L 94Low  98    CO2 22 - 32 mmol/L 33High  28    Glucose, Bld 70 - 99 mg/dL 643CVKF  840RFVO CM    Comment: Glucose reference range applies only to samples taken after fasting for at least 8 hours.  BUN 6 - 20 mg/dL <3KGO  9    Creatinine, Ser 0.61 - 1.24 mg/dL 7.70  3.40    Calcium 8.9 - 10.3 mg/dL 3.5CYE  1.8HTM    Total Protein 6.5 - 8.1 g/dL 9.3JPE  1.6KOE  6.9FQH   Albumin 3.5 - 5.0 g/dL 2.2VJD  0.5XGZ  3.5OIP   AST 15 - 41 U/L 675High  964High  959High   ALT 0 - 44 U/L 224High  264High  262High   Alkaline Phosphatase 38 - 126 U/L 57  64  66   Total Bilirubin 0.3 - 1.2 mg/dL 0.7  0.4  0.5   GFR calc non Af Amer >60 mL/min >60  >60    GFR calc Af Amer >60 mL/min >60  >60    Anion gap 5 - 15 8  9  CM      Since discharge he feels improved and is taking warfarin 6 weeks here to get an INR today and I will refer him to the anticoagulant clinic.  I asked him why he is on warfarin he does not have pharmaceutical benefits.  In my mind his pulmonary embolism was secondary to venous thrombosis due to compartment syndrome and rhabdomyolysis left lower extremity.  I would anticoagulate 6 months apart him back to the office at that time stop his anticoagulant and plan on doing an evaluation off anticoagulation for hypercoagulable syndrome.  His pulmonary symptoms have resolved no cough sputum hemoptysis shortness of breath he is having no edema or chest pain.  He has had no bleeding on his anticoagulant and has been compliant Past Medical History:  Diagnosis Date  . Bilateral pulmonary embolism (HCC) 10/19/2019  . Cannabis use disorder, moderate, dependence (HCC) 06/07/2018  . Chronic back pain   . Cold left foot 08/21/2019  . Depression   . History of rhabdomyolysis 10/19/2019  . Left leg weakness 08/21/2019  . Multifocal pneumonia 10/19/2019  .  Polysubstance abuse (HCC) 08/21/2019  . Rhabdomyolysis 08/21/2019  . Schizophrenia (HCC) 06/07/2018   Past Surgical History:  Procedure Laterality Date  . NONE      Current Medications: Current Meds  Medication Sig  . gabapentin (NEURONTIN) 100 MG capsule Take 1 capsule (100 mg total) by mouth 3 (three) times daily.  . mirtazapine (REMERON SOL-TAB) 15 MG disintegrating tablet Take 1 tablet (15 mg total) by mouth at bedtime.  06/09/2018 warfarin (COUMADIN) 10 MG tablet Take 1 tablet (10 mg total) by mouth daily.     Allergies:   Haloperidol lactate and Penicillins   Social History   Socioeconomic History  . Marital status: Single    Spouse name: Not on file  . Number of children: Not on file  . Years of education: Not on file  .  Highest education level: Not on file  Occupational History  . Not on file  Tobacco Use  . Smoking status: Current Every Day Smoker    Packs/day: 1.00    Types: Cigarettes  . Smokeless tobacco: Never Used  Vaping Use  . Vaping Use: Never used  Substance and Sexual Activity  . Alcohol use: Yes    Comment: occassional  . Drug use: Yes    Types: Marijuana, Cocaine    Comment: occassional  . Sexual activity: Yes  Other Topics Concern  . Not on file  Social History Narrative   ** Merged History Encounter **       Social Determinants of Health   Financial Resource Strain:   . Difficulty of Paying Living Expenses: Not on file  Food Insecurity:   . Worried About Programme researcher, broadcasting/film/video in the Last Year: Not on file  . Ran Out of Food in the Last Year: Not on file  Transportation Needs:   . Lack of Transportation (Medical): Not on file  . Lack of Transportation (Non-Medical): Not on file  Physical Activity:   . Days of Exercise per Week: Not on file  . Minutes of Exercise per Session: Not on file  Stress:   . Feeling of Stress : Not on file  Social Connections:   . Frequency of Communication with Friends and Family: Not on file  . Frequency of Social  Gatherings with Friends and Family: Not on file  . Attends Religious Services: Not on file  . Active Member of Clubs or Organizations: Not on file  . Attends Banker Meetings: Not on file  . Marital Status: Not on file     Family History: The patient's Family history is unknown by patient. ROS:   Please see the history of present illness.    All other systems reviewed and are negative.  EKGs/Labs/Other Studies Reviewed:    The following studies were reviewed today:   Recent Labs: 08/22/2019: Magnesium 2.0; TSH 0.336 10/20/2019: ALT 23 10/26/2019: BUN 13; Creatinine, Ser 0.90; Potassium 4.1; Sodium 137 10/29/2019: Hemoglobin 11.4; Platelets 598  Recent Lipid Panel No results found for: CHOL, TRIG, HDL, CHOLHDL, VLDL, LDLCALC, LDLDIRECT  Physical Exam:    VS:  BP 124/70   Pulse 86   Ht 6' (1.829 m)   Wt 252 lb 1.3 oz (114.3 kg)   SpO2 96%   BMI 34.19 kg/m     Wt Readings from Last 3 Encounters:  10/31/19 252 lb 1.3 oz (114.3 kg)  10/19/19 251 lb 12.3 oz (114.2 kg)  09/27/19 291 lb (132 kg)     GEN:  Well nourished, well developed in no acute distress HEENT: Normal NECK: No JVD; No carotid bruits LYMPHATICS: No lymphadenopathy CARDIAC: RRR, no murmurs, rubs, gallops RESPIRATORY:  Clear to auscultation without rales, wheezing or rhonchi  ABDOMEN: Soft, non-tender, non-distended MUSCULOSKELETAL:  No edema; No deformity  SKIN: Warm and dry NEUROLOGIC:  Alert and oriented x 3 PSYCHIATRIC:  Normal affect   He takes his boot off asked me to examine his foot is nonischemic he has full pulses has motor and sensory function.  He says he has ongoing pain in the foot since he had rhabdomyolysis and compartment syndrome.   Signed, Norman Herrlich, MD  10/31/2019 3:12 PM    [place] [facility]

## 2019-10-31 NOTE — Patient Instructions (Signed)
Medication Instructions:  Your physician recommends that you continue on your current medications as directed. Please refer to the Current Medication list given to you today.  *If you need a refill on your cardiac medications before your next appointment, please call your pharmacy*   Lab Work: Your physician recommends that you return for lab work in: TODAY PT/INR If you have labs (blood work) drawn today and your tests are completely normal, you will receive your results only by: Marland Kitchen MyChart Message (if you have MyChart) OR . A paper copy in the mail If you have any lab test that is abnormal or we need to change your treatment, we will call you to review the results.   Testing/Procedures: None   Follow-Up: At [facility], you and your health needs are our priority.  As part of our continuing mission to provide you with exceptional heart care, we have created designated Provider Care Teams.  These Care Teams include your primary Cardiologist (physician) and Advanced Practice Providers (APPs -  Physician Assistants and Nurse Practitioners) who all work together to provide you with the care you need, when you need it.  We recommend signing up for the patient portal called "MyChart".  Sign up information is provided on this After Visit Summary.  MyChart is used to connect with patients for Virtual Visits (Telemedicine).  Patients are able to view lab/test results, encounter notes, upcoming appointments, etc.  Non-urgent messages can be sent to your provider as well.   To learn more about what you can do with MyChart, go to [email or web address].    Your next appointment:   6 month(s)  The format for your next appointment:   In Person  Provider:   Norman Herrlich, MD   Other Instructions

## 2019-11-01 ENCOUNTER — Telehealth: Payer: Self-pay

## 2019-11-01 LAB — PROTIME-INR
INR: 2.6 — ABNORMAL HIGH (ref 0.9–1.2)
Prothrombin Time: 26.4 s — ABNORMAL HIGH (ref 9.1–12.0)

## 2019-11-01 NOTE — Telephone Encounter (Signed)
Tried calling patient. No answer and no voicemail set up for me to leave a message. 

## 2019-11-01 NOTE — Telephone Encounter (Signed)
Spoke with patient regarding results and recommendation.  Patient verbalizes understanding and is agreeable to plan of care. Advised patient to call back with any issues or concerns.  

## 2019-11-01 NOTE — Telephone Encounter (Signed)
Pt called back in returning Morgans call  Best number is [phone]

## 2019-11-01 NOTE — Telephone Encounter (Signed)
Called 2nd time phone not set up

## 2019-11-01 NOTE — Telephone Encounter (Signed)
Patient called to provide his new phone number. He states the old phone number is no longer in use. Please contact patient at [phone].

## 2019-11-01 NOTE — Telephone Encounter (Signed)
-----   Message from Baldo Daub, MD sent at 11/01/2019  7:40 AM EDT ----- His INR is in range I would continue the same dose and this needs to be followed up with through the anticoagulation clinic [facility]

## 2019-11-01 NOTE — Telephone Encounter (Signed)
Left message on patients voicemail to please return our call.   

## 2019-11-16 ENCOUNTER — Other Ambulatory Visit: Payer: Self-pay

## 2019-11-16 ENCOUNTER — Ambulatory Visit (INDEPENDENT_AMBULATORY_CARE_PROVIDER_SITE_OTHER): Payer: Self-pay

## 2019-11-16 DIAGNOSIS — I2699 Other pulmonary embolism without acute cor pulmonale: Secondary | ICD-10-CM

## 2019-11-16 DIAGNOSIS — Z5181 Encounter for therapeutic drug level monitoring: Secondary | ICD-10-CM

## 2019-11-16 DIAGNOSIS — Z7901 Long term (current) use of anticoagulants: Secondary | ICD-10-CM

## 2019-11-16 LAB — POCT INR: INR: 1.2 — AB (ref 2.0–3.0)

## 2019-11-16 NOTE — Patient Instructions (Addendum)
Take 3 tablets tonight only and then continue taking  2 tablets (10 mg) Daily.  Repeat INR 1 week.

## 2019-11-21 ENCOUNTER — Other Ambulatory Visit: Payer: Self-pay

## 2019-11-21 ENCOUNTER — Ambulatory Visit (INDEPENDENT_AMBULATORY_CARE_PROVIDER_SITE_OTHER): Payer: Self-pay

## 2019-11-21 DIAGNOSIS — Z5181 Encounter for therapeutic drug level monitoring: Secondary | ICD-10-CM

## 2019-11-21 DIAGNOSIS — I2699 Other pulmonary embolism without acute cor pulmonale: Secondary | ICD-10-CM

## 2019-11-21 DIAGNOSIS — Z7901 Long term (current) use of anticoagulants: Secondary | ICD-10-CM

## 2019-11-21 LAB — POCT INR: INR: 1.9 — AB (ref 2.0–3.0)

## 2019-11-21 NOTE — Patient Instructions (Signed)
Take 3 tablets tonight only and then continue taking  2 tablets (10 mg) Daily.  Repeat INR 1 week.  Please use Code [id number]

## 2019-11-23 ENCOUNTER — Other Ambulatory Visit: Payer: Self-pay

## 2019-11-23 ENCOUNTER — Emergency Department (HOSPITAL_COMMUNITY): Payer: Self-pay

## 2019-11-23 ENCOUNTER — Emergency Department (HOSPITAL_COMMUNITY)
Admission: EM | Admit: 2019-11-23 | Discharge: 2019-11-23 | Disposition: A | Discharge status: Home / Self Care | Payer: Self-pay | Attending: Emergency Medicine | Admitting: Emergency Medicine

## 2019-11-23 DIAGNOSIS — R0603 Acute respiratory distress: Secondary | ICD-10-CM | POA: Insufficient documentation

## 2019-11-23 DIAGNOSIS — K2920 Alcoholic gastritis without bleeding: Secondary | ICD-10-CM

## 2019-11-23 DIAGNOSIS — Z20822 Contact with and (suspected) exposure to covid-19: Secondary | ICD-10-CM | POA: Insufficient documentation

## 2019-11-23 DIAGNOSIS — F17210 Nicotine dependence, cigarettes, uncomplicated: Secondary | ICD-10-CM | POA: Insufficient documentation

## 2019-11-23 DIAGNOSIS — T50901A Poisoning by unspecified drugs, medicaments and biological substances, accidental (unintentional), initial encounter: Secondary | ICD-10-CM

## 2019-11-23 DIAGNOSIS — K2921 Alcoholic gastritis with bleeding: Secondary | ICD-10-CM | POA: Insufficient documentation

## 2019-11-23 LAB — RESPIRATORY PANEL BY RT PCR (FLU A&B, COVID)
Influenza A by PCR: NEGATIVE
Influenza B by PCR: NEGATIVE
SARS Coronavirus 2 by RT PCR: NEGATIVE

## 2019-11-23 LAB — I-STAT VENOUS BLOOD GAS, ED
Acid-Base Excess: 0 mmol/L (ref 0.0–2.0)
Bicarbonate: 27.5 mmol/L (ref 20.0–28.0)
Calcium, Ion: 1.21 mmol/L (ref 1.15–1.40)
HCT: 40 % (ref 39.0–52.0)
Hemoglobin: 13.6 g/dL (ref 13.0–17.0)
O2 Saturation: 92 %
Potassium: 3.5 mmol/L (ref 3.5–5.1)
Sodium: 143 mmol/L (ref 135–145)
TCO2: 29 mmol/L (ref 22–32)
pCO2, Ven: 58.3 mmHg (ref 44.0–60.0)
pH, Ven: 7.281 (ref 7.250–7.430)
pO2, Ven: 75 mmHg — ABNORMAL HIGH (ref 32.0–45.0)

## 2019-11-23 LAB — CBC WITH DIFFERENTIAL/PLATELET
Abs Immature Granulocytes: 0.04 10*3/uL (ref 0.00–0.07)
Basophils Absolute: 0 10*3/uL (ref 0.0–0.1)
Basophils Relative: 0 %
Eosinophils Absolute: 0 10*3/uL (ref 0.0–0.5)
Eosinophils Relative: 0 %
HCT: 38.5 % — ABNORMAL LOW (ref 39.0–52.0)
Hemoglobin: 11.7 g/dL — ABNORMAL LOW (ref 13.0–17.0)
Immature Granulocytes: 1 %
Lymphocytes Relative: 13 %
Lymphs Abs: 1.1 10*3/uL (ref 0.7–4.0)
MCH: 27 pg (ref 26.0–34.0)
MCHC: 30.4 g/dL (ref 30.0–36.0)
MCV: 88.7 fL (ref 80.0–100.0)
Monocytes Absolute: 0.5 10*3/uL (ref 0.1–1.0)
Monocytes Relative: 6 %
Neutro Abs: 7.1 10*3/uL (ref 1.7–7.7)
Neutrophils Relative %: 80 %
Platelets: 259 10*3/uL (ref 150–400)
RBC: 4.34 MIL/uL (ref 4.22–5.81)
RDW: 13.2 % (ref 11.5–15.5)
WBC: 8.9 10*3/uL (ref 4.0–10.5)
nRBC: 0 % (ref 0.0–0.2)

## 2019-11-23 LAB — COMPREHENSIVE METABOLIC PANEL
ALT: 24 U/L (ref 0–44)
AST: 21 U/L (ref 15–41)
Albumin: 3.7 g/dL (ref 3.5–5.0)
Alkaline Phosphatase: 58 U/L (ref 38–126)
Anion gap: 10 (ref 5–15)
BUN: 10 mg/dL (ref 6–20)
CO2: 25 mmol/L (ref 22–32)
Calcium: 8.6 mg/dL — ABNORMAL LOW (ref 8.9–10.3)
Chloride: 103 mmol/L (ref 98–111)
Creatinine, Ser: 0.81 mg/dL (ref 0.61–1.24)
GFR, Estimated: >60 mL/min (ref >60)
Glucose, Bld: 94 mg/dL (ref 70–99)
Potassium: 3.5 mmol/L (ref 3.5–5.1)
Sodium: 138 mmol/L (ref 135–145)
Total Bilirubin: 0.6 mg/dL (ref 0.3–1.2)
Total Protein: 6.9 g/dL (ref 6.5–8.1)

## 2019-11-23 LAB — ETHANOL: Alcohol, Ethyl (B): 45 mg/dL — ABNORMAL HIGH (ref <10)

## 2019-11-23 LAB — I-STAT CHEM 8, ED
BUN: 10 mg/dL (ref 6–20)
Calcium, Ion: 1.2 mmol/L (ref 1.15–1.40)
Chloride: 104 mmol/L (ref 98–111)
Creatinine, Ser: 0.8 mg/dL (ref 0.61–1.24)
Glucose, Bld: 88 mg/dL (ref 70–99)
HCT: 42 % (ref 39.0–52.0)
Hemoglobin: 14.3 g/dL (ref 13.0–17.0)
Potassium: 3.5 mmol/L (ref 3.5–5.1)
Sodium: 143 mmol/L (ref 135–145)
TCO2: 24 mmol/L (ref 22–32)

## 2019-11-23 LAB — TROPONIN I (HIGH SENSITIVITY)
Troponin I (High Sensitivity): 4 ng/L (ref <18)
Troponin I (High Sensitivity): 10 ng/L (ref <18)

## 2019-11-23 LAB — BRAIN NATRIURETIC PEPTIDE: B Natriuretic Peptide: 36.9 pg/mL (ref 0.0–100.0)

## 2019-11-23 LAB — SALICYLATE LEVEL: Salicylate Lvl: <7 mg/dL — ABNORMAL LOW (ref 7.0–30.0)

## 2019-11-23 LAB — PROTIME-INR
INR: 2.3 — ABNORMAL HIGH (ref 0.8–1.2)
Prothrombin Time: 24.6 seconds — ABNORMAL HIGH (ref 11.4–15.2)

## 2019-11-23 LAB — ACETAMINOPHEN LEVEL: Acetaminophen (Tylenol), Serum: <10 ug/mL — ABNORMAL LOW (ref 10–30)

## 2019-11-23 MED ORDER — ONDANSETRON HCL 4 MG/2ML IJ SOLN
INTRAMUSCULAR | Status: AC
  Administered 2019-11-23: 4 mg via INTRAVENOUS
  Filled 2019-11-23: qty 2

## 2019-11-23 MED ORDER — ONDANSETRON HCL 4 MG/2ML IJ SOLN: 4.0000 mg | Freq: Once | INTRAMUSCULAR | Status: AC

## 2019-11-23 MED ORDER — ESOMEPRAZOLE MAGNESIUM 40 MG PO CPDR: 40.0000 mg | DELAYED_RELEASE_CAPSULE | Freq: Every day | ORAL | 0 refills | Status: AC

## 2019-11-23 MED ORDER — SODIUM CHLORIDE 0.9 % IV BOLUS
1000.0000 mL | Freq: Once | INTRAVENOUS | Status: AC
  Administered 2019-11-23: 1000 mL via INTRAVENOUS

## 2019-11-23 MED ORDER — FAMOTIDINE IN NACL 20-0.9 MG/50ML-% IV SOLN
20.0000 mg | Freq: Once | INTRAVENOUS | Status: AC
  Administered 2019-11-23: 20 mg via INTRAVENOUS
  Filled 2019-11-23: qty 50

## 2019-11-23 MED ORDER — SODIUM CHLORIDE 0.9 % IV SOLN
80.0000 mg | Freq: Once | INTRAVENOUS | Status: AC
  Administered 2019-11-23: 20:00:00 80 mg via INTRAVENOUS
  Filled 2019-11-23: qty 80

## 2019-11-23 MED ORDER — DIPHENHYDRAMINE HCL 50 MG/ML IJ SOLN
25.0000 mg | Freq: Once | INTRAMUSCULAR | Status: AC
  Administered 2019-11-23: 25 mg via INTRAVENOUS
  Filled 2019-11-23: qty 1

## 2019-11-23 MED ORDER — METOCLOPRAMIDE HCL 5 MG/ML IJ SOLN
10.0000 mg | Freq: Once | INTRAMUSCULAR | Status: AC
  Administered 2019-11-23: 10 mg via INTRAVENOUS
  Filled 2019-11-23: qty 2

## 2019-11-23 NOTE — ED Triage Notes (Signed)
Pt bib EMS for unresponsive over dose on opioids. 2mg  of Narcan given intranasally and pt bagged for 4-5 minutes. Pt came to and began coughing up bright red blood clots. 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 On warfarin, last dose yesterday.  18G Left AC placed by EMS  Vitals: 148/62 HR: 80 O2: 100% 5L  CBG: 156

## 2019-11-23 NOTE — ED Notes (Signed)
Pt aao4, [id number], reporting resolved s/s, requesting to be discharged at this time, vss at this time, breathing nonlabored with symmetrical chest rise bilaterally. Side rails up, call bell in reach.

## 2019-11-23 NOTE — Discharge Instructions (Signed)
You vomited blood likely from drinking alcohol and Coumadin use and oxycodone use.  Hold Coumadin tonight.  Please avoid taking too much oxycodone.  Please take Nexium daily.  You will need to see a GI doctor for follow-up  Return to ER if you have worse abdominal pain, vomiting blood or coughing up blood, overdose

## 2019-11-23 NOTE — ED Notes (Signed)
Pt waiting on ride eta

## 2019-11-23 NOTE — ED Notes (Signed)
md at bedside, pt aao4, gcs20, passes fluid challenge. NADN.

## 2019-11-23 NOTE — ED Provider Notes (Addendum)
[facility] EMERGENCY DEPARTMENT Provider Note   CSN: [id number] Arrival date & time: 11/23/19  1703     History No chief complaint on file.   Andrew [name] is a 29 y.o. male woke up on Coumadin, chronic back pain with heroin and marijuana abuse, schizophrenia here presenting with overdose and coughing up blood.  Patient states that he did take some oxycodone.  He states that he cannot tell me how much he took.  He states that he was just having a lot of worsening chronic back pain.  He denies trying to kill himself.  He was noted to be altered and was given Narcan.  He states that subsequently he started coughing up blood.  Patient was noted to be in moderate respiratory distress and was put on 5 L nasal cannula.  Patient states that he did not take his Coumadin today but did take it yesterday.   The history is provided by the patient.       Past Medical History:  Diagnosis Date  . Bilateral pulmonary embolism (HCC) 10/19/2019  . Cannabis use disorder, moderate, dependence (HCC) 06/07/2018  . Chronic back pain   . Cold left foot 08/21/2019  . Depression   . History of rhabdomyolysis 10/19/2019  . Left leg weakness 08/21/2019  . Multifocal pneumonia 10/19/2019  . Polysubstance abuse (HCC) 08/21/2019  . Rhabdomyolysis 08/21/2019  . Schizophrenia (HCC) 06/07/2018    Patient Active Problem List   Diagnosis Date Noted  . Long term (current) use of anticoagulants 10/31/2019  . Depression   . Chronic back pain   . Bilateral pulmonary embolism (HCC) 10/19/2019  . History of rhabdomyolysis 10/19/2019  . Multifocal pneumonia 10/19/2019  . Left leg weakness 08/21/2019  . Cold left foot 08/21/2019  . Polysubstance abuse (HCC) 08/21/2019  . Rhabdomyolysis 08/21/2019  . Cannabis use disorder, moderate, dependence (HCC) 06/07/2018  . Schizophrenia (HCC) 06/07/2018    Past Surgical History:  Procedure Laterality Date  . NONE         Family History   Family history unknown: Yes    Social History   Tobacco Use  . Smoking status: Current Every Day Smoker    Packs/day: 1.00    Types: Cigarettes  . Smokeless tobacco: Never Used  Vaping Use  . Vaping Use: Never used  Substance Use Topics  . Alcohol use: Yes    Comment: occassional  . Drug use: Yes    Types: Marijuana, Cocaine    Comment: occassional    Home Medications Prior to Admission medications   Medication Sig Start Date End Date Taking? Authorizing Provider  gabapentin (NEURONTIN) 100 MG capsule Take 1 capsule (100 mg total) by mouth 3 (three) times daily. 10/29/19   Briant Cedar, MD  mirtazapine (REMERON SOL-TAB) 15 MG disintegrating tablet Take 1 tablet (15 mg total) by mouth at bedtime. 10/29/19 11/28/19  Briant Cedar, MD  warfarin (COUMADIN) 10 MG tablet Take 1 tablet (10 mg total) by mouth daily. 10/29/19 11/28/19  Briant Cedar, MD    Allergies    Haloperidol lactate and Penicillins  Review of Systems   Review of Systems  Respiratory: Positive for shortness of breath.   All other systems reviewed and are negative.   Physical Exam Updated Vital Signs BP 111/76 (BP Location: Left Arm)   Pulse 78   Temp 97.7 F (36.5 C) (Oral)   Resp (!) 24   SpO2 93%   Physical Exam Vitals and nursing note reviewed.  HENT:     Head: Normocephalic.     Nose: Nose normal.     Mouth/Throat:     Mouth: Mucous membranes are moist.  Eyes:     Extraocular Movements: Extraocular movements intact.     Pupils: Pupils are equal, round, and reactive to light.  Cardiovascular:     Rate and Rhythm: Normal rate and regular rhythm.     Pulses: Normal pulses.     Heart sounds: Normal heart sounds.  Pulmonary:     Comments: Crackles bilateral bases Abdominal:     General: Abdomen is flat.     Palpations: Abdomen is soft.  Musculoskeletal:        General: Normal range of motion.     Cervical back: Normal range of motion.  Skin:    General: Skin is  warm.     Capillary Refill: Capillary refill takes less than 2 seconds.  Neurological:     General: No focal deficit present.     Mental Status: He is alert and oriented to person, place, and time.  Psychiatric:        Behavior: Behavior normal.     ED Results / Procedures / Treatments   Labs (all labs ordered are listed, but only abnormal results are displayed) Labs Reviewed  RESPIRATORY PANEL BY RT PCR (FLU A&B, COVID)  CBC WITH DIFFERENTIAL/PLATELET  COMPREHENSIVE METABOLIC PANEL  BRAIN NATRIURETIC PEPTIDE  ETHANOL  SALICYLATE LEVEL  ACETAMINOPHEN LEVEL  RAPID URINE DRUG SCREEN, HOSP PERFORMED  PROTIME-INR  I-STAT CHEM 8, ED  TROPONIN I (HIGH SENSITIVITY)    EKG EKG Interpretation  Date/Time:  Friday November 23 2019 17:09:20 EST Ventricular Rate:  79 PR Interval:    QRS Duration: 92 QT Interval:  384 QTC Calculation: 441 R Axis:   56 Text Interpretation: Sinus rhythm No significant change since last tracing Confirmed by Richardean Canal [id number]) on 11/23/2019 5:11:57 PM   Radiology No results found.  Procedures Procedures (including critical care time)  Medications Ordered in ED Medications  ondansetron (ZOFRAN) 4 MG/2ML injection (has no administration in time range)    ED Course  I have reviewed the triage vital signs and the nursing notes.  Pertinent labs & imaging results that were available during my care of the patient were reviewed by me and considered in my medical decision making (see chart for details).    MDM Rules/Calculators/A&P                         Andrew [name] is a 29 y.o. male presented with possible overdose and coughing up blood.  Patient is on Coumadin for recent PE.  Patient also overdosed on opiates.  Patient is awake and alert after Narcan.  Unclear if he has hematemesis or hemoptysis.  Will get a chest x-ray and check CBC and CMP and PT/INR.  Will observe closely.  7 pm Hemoglobin was 11.7.  Patient did have an episode  of bright red blood vomit.  I think likely secondary to his Narcan and also patient has been drinking alcohol so alcohol gastritis is also possible.  Plan to give PPI and Pepcid.  Patient is more awake now.  If he tolerates p.o. anticipate discharge.  8:52 PM Patient tolerated p.o.  Will discharge patient home with PPI and told him to stop drinking alcohol and doing drugs.  Patient is awake and alert.  INR is 2.3.  Told him to hold his Coumadin tonight.  Final  Clinical Impression(s) / ED Diagnoses Final diagnoses:  None    Rx / DC Orders ED Discharge Orders    None       Charlynne Pander, MD 11/23/19 2053    Charlynne Pander, MD 11/23/19 2053

## 2019-11-23 NOTE — ED Notes (Addendum)
Pt given bagged food and gingerale per Dr. Silverio Lay

## 2019-11-28 ENCOUNTER — Telehealth: Payer: Self-pay | Admitting: Cardiology

## 2019-11-28 NOTE — Telephone Encounter (Signed)
Patient states that he has court this morning and someone will drop him off at the [facility] office once he is done with court. His coumadin clinic appointment is not until 1:45 pm but he expects to be at the office by 10-10:30.   Called [facility] clinic and they are able to fit him in at 10:30 am.   Called patient back to confirm appointment change and he states he is on the "late docket" at court now and is unsure if he will make it at that time.   Advised patient that the clinic schedule is full today and if he arrives after 10:45 he will have to keep his later appointment or reschedule. He states he will attempt to make the 10:30am slot.   Routing to [employer] as [employer]

## 2019-11-28 NOTE — Telephone Encounter (Signed)
New message:     Patient calling stating that he will be at his coumadin apt around 10 or 10:30. Patient appt is at 1:45 pm. Patient states he has court this morning and only have one ride. I called over they was no answer. I told the patient all I could do is send a message. Please call patient.

## 2019-12-10 ENCOUNTER — Telehealth: Payer: Self-pay | Admitting: Cardiology

## 2019-12-10 MED ORDER — WARFARIN SODIUM 5 MG PO TABS
10.0000 mg | ORAL_TABLET | Freq: Every day | ORAL | 0 refills | Status: DC
Start: 2019-12-10 — End: 2019-12-17

## 2019-12-10 MED ORDER — WARFARIN SODIUM 10 MG PO TABS: 10.0000 mg | ORAL_TABLET | Freq: Every day | ORAL | 0 refills | Status: DC

## 2019-12-10 NOTE — Telephone Encounter (Signed)
Also ordered the wrong dose initially so I called and cancelled the 10mg  and then reordered the 5mg 

## 2019-12-10 NOTE — Telephone Encounter (Signed)
I called and explained to the pt that since they've been off warfarin for 5 days that we would have to send in refil for 5 days only and then scheduled him for an appt next Monday. Pt wanted more than a one week supply but I advised him if he makes appts then he may obtain more.

## 2019-12-10 NOTE — Telephone Encounter (Signed)
Andrew [name] is calling requesting to speak with the coumadin clinic due to missing his coumadin appointment and being out of medication. I advised him the latest appointment time available for today is 8:45 am, and he stated he would be unable to make it by then. I then advised him the next available would be this Wednesday 12/12/19 and he stated he could not wait that long and requested a callback instead of scheduling. Please advise.

## 2019-12-17 ENCOUNTER — Other Ambulatory Visit: Payer: Self-pay

## 2019-12-17 ENCOUNTER — Ambulatory Visit (INDEPENDENT_AMBULATORY_CARE_PROVIDER_SITE_OTHER): Payer: Self-pay

## 2019-12-17 DIAGNOSIS — Z7901 Long term (current) use of anticoagulants: Secondary | ICD-10-CM

## 2019-12-17 DIAGNOSIS — Z5181 Encounter for therapeutic drug level monitoring: Secondary | ICD-10-CM

## 2019-12-17 DIAGNOSIS — I2699 Other pulmonary embolism without acute cor pulmonale: Secondary | ICD-10-CM

## 2019-12-17 LAB — POCT INR: INR: 2.8 (ref 2.0–3.0)

## 2019-12-17 MED ORDER — WARFARIN SODIUM 5 MG PO TABS: 10.0000 mg | ORAL_TABLET | Freq: Every day | ORAL | 0 refills | Status: AC

## 2019-12-17 NOTE — Patient Instructions (Signed)
continue taking  2 tablets (10 mg) Daily.  Repeat INR 2 weeks.  Please use Code [id number]

## 2019-12-31 ENCOUNTER — Encounter (HOSPITAL_BASED_OUTPATIENT_CLINIC_OR_DEPARTMENT_OTHER): Payer: Self-pay

## 2019-12-31 ENCOUNTER — Other Ambulatory Visit: Payer: Self-pay

## 2019-12-31 ENCOUNTER — Emergency Department (HOSPITAL_BASED_OUTPATIENT_CLINIC_OR_DEPARTMENT_OTHER)
Admission: EM | Admit: 2019-12-31 | Discharge: 2019-12-31 | Disposition: A | Discharge status: Home / Self Care | Payer: Self-pay | Attending: Emergency Medicine | Admitting: Emergency Medicine

## 2019-12-31 DIAGNOSIS — Z7901 Long term (current) use of anticoagulants: Secondary | ICD-10-CM | POA: Insufficient documentation

## 2019-12-31 DIAGNOSIS — G629 Polyneuropathy, unspecified: Secondary | ICD-10-CM | POA: Insufficient documentation

## 2019-12-31 DIAGNOSIS — Z86711 Personal history of pulmonary embolism: Secondary | ICD-10-CM | POA: Insufficient documentation

## 2019-12-31 DIAGNOSIS — F17210 Nicotine dependence, cigarettes, uncomplicated: Secondary | ICD-10-CM | POA: Insufficient documentation

## 2019-12-31 DIAGNOSIS — K5900 Constipation, unspecified: Secondary | ICD-10-CM | POA: Insufficient documentation

## 2019-12-31 MED ORDER — GABAPENTIN 100 MG PO CAPS: 100.0000 mg | ORAL_CAPSULE | Freq: Three times a day (TID) | ORAL | 0 refills | Status: AC

## 2019-12-31 NOTE — ED Notes (Signed)
EDP made aware of patient's refusal for blood drawn.

## 2019-12-31 NOTE — Discharge Instructions (Signed)
Please follow up with Dr. Dion Saucier regarding your left leg. He will need to evaluate you and decide whether he still wants you in the boot. He can also prescribe your gabapentin for you.   Follow up with your cardiologist to have your INR checked. You have refused INR check here today.   It sounds as if you are having constipation type symptoms. I would recommend taking Miralax (can be bought OTC) daily to help. Increase the amount of water you take daily and increase the fiber in your diet.   Return to the ED for any worsening symptoms

## 2019-12-31 NOTE — ED Provider Notes (Signed)
[facility] EMERGENCY DEPARTMENT Provider Note   CSN: [id number] Arrival date & time: 12/31/19  1047     History Chief Complaint  Patient presents with  . Leg Pain    Andrew [name] is a 29 y.o. male with PMHx bilateral PE (on Coumadin, hx of rhabdomyolysis and compartment syndrome in LLE (august of this year), schizophrenia, and polysubstance abuse who presents to the ED today requesting medication refill. Pt is requesting refill of his gabapentin today - reports he has hx of neuropathy and has been out of his medication. He is also requesting a new boot for his LLE. Per chart review pt was admitted to the hospital in August with rhabdo and compartment syndrome and has been wearing a boot since then. He states he saw the orthopedist after his hospitalization but when he was scheduled to go back he was back in the hospital with his PE. He has not followed up with ortho and is unsure if they still want him in the boot however he would feel more comfortable in the boot and states his boot is breaking.   Pt also has complaint of "diarrhea" since being on Coumadin (October). He describes the stool as "small pieces" that aren't 1 solid formed stool. Denies loose stool or watery diarrhea. Pt denies any abdominal pain. He has not been taking anything for the medicine. He states he discussed this with his cardiologist and was told it was not a symptom of the medication.   The history is provided by the patient and medical records.       Past Medical History:  Diagnosis Date  . Bilateral pulmonary embolism (HCC) 10/19/2019  . Cannabis use disorder, moderate, dependence (HCC) 06/07/2018  . Chronic back pain   . Cold left foot 08/21/2019  . Depression   . History of rhabdomyolysis 10/19/2019  . Left leg weakness 08/21/2019  . Multifocal pneumonia 10/19/2019  . Polysubstance abuse (HCC) 08/21/2019  . Rhabdomyolysis 08/21/2019  . Schizophrenia (HCC) 06/07/2018    Patient Active  Problem List   Diagnosis Date Noted  . Long term (current) use of anticoagulants 10/31/2019  . Depression   . Chronic back pain   . Bilateral pulmonary embolism (HCC) 10/19/2019  . History of rhabdomyolysis 10/19/2019  . Multifocal pneumonia 10/19/2019  . Left leg weakness 08/21/2019  . Cold left foot 08/21/2019  . Polysubstance abuse (HCC) 08/21/2019  . Rhabdomyolysis 08/21/2019  . Cannabis use disorder, moderate, dependence (HCC) 06/07/2018  . Schizophrenia (HCC) 06/07/2018    Past Surgical History:  Procedure Laterality Date  . NONE         Family History  Family history unknown: Yes    Social History   Tobacco Use  . Smoking status: Current Every Day Smoker    Packs/day: 1.00    Types: Cigarettes  . Smokeless tobacco: Never Used  Vaping Use  . Vaping Use: Never used  Substance Use Topics  . Alcohol use: Yes    Comment: occassional  . Drug use: Yes    Types: Marijuana, Cocaine    Comment: denies today 12/31/19    Home Medications Prior to Admission medications   Medication Sig Start Date End Date Taking? Authorizing Provider  esomeprazole (NEXIUM) 40 MG capsule Take 1 capsule (40 mg total) by mouth daily. 11/23/19   Charlynne Pander, MD  gabapentin (NEURONTIN) 100 MG capsule Take 1 capsule (100 mg total) by mouth 3 (three) times daily. 12/31/19   Tanda Rockers, PA-C  mirtazapine (REMERON  SOL-TAB) 15 MG disintegrating tablet Take 1 tablet (15 mg total) by mouth at bedtime. Patient not taking: Reported on 11/23/2019 10/29/19 11/28/19  Briant Cedar, MD  mirtazapine (REMERON) 15 MG tablet Take 15 mg by mouth at bedtime. 10/29/19   [provider]  warfarin (COUMADIN) 5 MG tablet Take 2 tablets (10 mg total) by mouth daily. MUST MAKE COUMADIN APPOINTMENT FOR FURTHER REFILLS 12/17/19   Baldo Daub, MD    Allergies    Haloperidol lactate and Penicillins  Review of Systems   Review of Systems  Constitutional: Negative for chills and  fever.  Respiratory: Negative for shortness of breath.   Cardiovascular: Negative for chest pain.  Gastrointestinal: Positive for constipation. Negative for abdominal pain.  Musculoskeletal: Positive for arthralgias.  Neurological: Negative for weakness and numbness.  All other systems reviewed and are negative.   Physical Exam Updated Vital Signs BP 115/77 (BP Location: Right Arm)   Pulse (!) 56   Temp 98.4 F (36.9 C) (Oral)   Resp 18   Ht 6' (1.829 m)   Wt 117.9 kg   SpO2 99%   BMI 35.26 kg/m   Physical Exam Vitals and nursing note reviewed.  Constitutional:      Appearance: He is not ill-appearing or diaphoretic.  HENT:     Head: Normocephalic and atraumatic.  Eyes:     Conjunctiva/sclera: Conjunctivae normal.  Cardiovascular:     Rate and Rhythm: Normal rate and regular rhythm.     Pulses: Normal pulses.  Pulmonary:     Effort: Pulmonary effort is normal.     Breath sounds: Normal breath sounds. No wheezing, rhonchi or rales.  Abdominal:     Palpations: Abdomen is soft.     Tenderness: There is no abdominal tenderness. There is no guarding or rebound.  Musculoskeletal:     Cervical back: Neck supple.     Comments: Ankle monitor on right ankle.  Boot on left ankle; removed. No TTP. ROM intact to hip, knee, and ankle. Cap refill < 2 seconds. Sensation and strength intact. 2+ DP pulse.   Skin:    General: Skin is warm and dry.  Neurological:     Mental Status: He is alert.     ED Results / Procedures / Treatments   Labs (all labs ordered are listed, but only abnormal results are displayed) Labs Reviewed  PROTIME-INR    EKG None  Radiology No results found.  Procedures Procedures (including critical care time)  Medications Ordered in ED Medications - No data to display  ED Course  I have reviewed the triage vital signs and the nursing notes.  Pertinent labs & imaging results that were available during my care of the patient were reviewed by me  and considered in my medical decision making (see chart for details).    MDM Rules/Calculators/A&P                          29 year old male who presents to the ED today questing medication refill of his gabapentin.  Reports he has a history of neuropathy in his left lower extremity and has been out of his neuropathy.  Patient also is requesting that he have a new cam walker, was admitted to the hospital in August for rhabdo and compartment syndrome and has been placed in a boot.  He is unsure if he is supposed to still be in the boot however states he would feel more comfortable.  He denies any worsening pain at this time.  On exam he has good distal pulses, no tenderness palpation of the entire left lower extremity.  Range of motion intact.  Do not feel he needs an imaging at this time.  Will provide new boot and prescribe his gabapentin.  I have informed that he will need to follow-up with orthopedics, will give him information for follow-up as he states he forgot where the office was at.  Patient also reports that he has been having "diarrhea" since being on Coumadin in October for bilateral PEs.  Last INR check on the sixth 2.5.  He describes the stool as dilated and small and not one large formed stool.  He denies any watery diarrhea or looser stool.  No abdominal tenderness palpation on exam.  When I discussed with patient that this sounds more like constipation he seems surprised as he thought constipation was that you could not go to the bathroom whatsoever which he has not been having.  I have discussed MiraLAX with him daily and PCP follow-up.  Will check INR at this time as patient was supposed to get it done today and then discharged home as I do not feel he needs additional work-up at this time.  Pt refused INR check.  He states he has an appointment on the 24th.  Will discharge.   This note was prepared using Dragon voice recognition software and may include unintentional dictation errors  due to the inherent limitations of voice recognition software.  Final Clinical Impression(s) / ED Diagnoses Final diagnoses:  Neuropathy  Constipation, unspecified constipation type    Rx / DC Orders ED Discharge Orders         Ordered    gabapentin (NEURONTIN) 100 MG capsule  3 times daily        12/31/19 1354           Discharge Instructions     Please follow up with Dr. Dion Saucier regarding your left leg. He will need to evaluate you and decide whether he still wants you in the boot. He can also prescribe your gabapentin for you.   Follow up with your cardiologist to have your INR checked. You have refused INR check here today.   It sounds as if you are having constipation type symptoms. I would recommend taking Miralax (can be bought OTC) daily to help. Increase the amount of water you take daily and increase the fiber in your diet.   Return to the ED for any worsening symptoms       Tanda Rockers, PA-C 12/31/19 1404    Terrilee Files, MD 12/31/19 [phone]

## 2019-12-31 NOTE — ED Notes (Signed)
Patient refused blood drawn stating that he is going to for lab work on Friday, the 24th.

## 2019-12-31 NOTE — ED Notes (Signed)
Pt c/o left lower leg tingling  If he pushes on it shoots a pain down leg States had n/v/d last pm but nothing today

## 2019-12-31 NOTE — ED Triage Notes (Signed)
Pt arrives ambulatory in boot with c/o pain to LLE, also reports PE's states that he is taking warfarin and thinks that it is causing him to have diarrhea and upset stomach.

## 2020-01-24 ENCOUNTER — Telehealth: Payer: Self-pay

## 2020-01-24 NOTE — Telephone Encounter (Signed)
Called and lmomed overdue inr 

## 2020-02-28 ENCOUNTER — Telehealth: Payer: Self-pay

## 2020-02-28 NOTE — Telephone Encounter (Signed)
Unable to lmom for overdue inr 

## 2020-03-27 ENCOUNTER — Telehealth: Payer: Self-pay

## 2020-03-27 NOTE — Telephone Encounter (Signed)
Unable to lmom for overdue inr 

## 2020-11-18 ENCOUNTER — Telehealth: Payer: Self-pay

## 2020-11-18 NOTE — Telephone Encounter (Signed)
INR overdue. Called, no answer. Unable to leave voicemail.  

## 2020-11-26 ENCOUNTER — Other Ambulatory Visit: Payer: Self-pay

## 2020-11-26 ENCOUNTER — Ambulatory Visit (INDEPENDENT_AMBULATORY_CARE_PROVIDER_SITE_OTHER): Payer: Self-pay

## 2020-11-26 ENCOUNTER — Other Ambulatory Visit: Payer: Self-pay | Admitting: Physician Assistant

## 2020-11-26 DIAGNOSIS — Z021 Encounter for pre-employment examination: Secondary | ICD-10-CM

## 2020-12-18 ENCOUNTER — Other Ambulatory Visit: Payer: Self-pay

## 2020-12-18 ENCOUNTER — Emergency Department (HOSPITAL_BASED_OUTPATIENT_CLINIC_OR_DEPARTMENT_OTHER)
Admission: EM | Admit: 2020-12-18 | Discharge: 2020-12-18 | Disposition: A | Discharge status: Home / Self Care | Payer: Self-pay | Attending: Student | Admitting: Student

## 2020-12-18 ENCOUNTER — Encounter (HOSPITAL_BASED_OUTPATIENT_CLINIC_OR_DEPARTMENT_OTHER): Payer: Self-pay

## 2020-12-18 DIAGNOSIS — J029 Acute pharyngitis, unspecified: Secondary | ICD-10-CM | POA: Insufficient documentation

## 2020-12-18 DIAGNOSIS — R5383 Other fatigue: Secondary | ICD-10-CM | POA: Insufficient documentation

## 2020-12-18 DIAGNOSIS — F17210 Nicotine dependence, cigarettes, uncomplicated: Secondary | ICD-10-CM | POA: Insufficient documentation

## 2020-12-18 DIAGNOSIS — R059 Cough, unspecified: Secondary | ICD-10-CM | POA: Insufficient documentation

## 2020-12-18 DIAGNOSIS — Z7901 Long term (current) use of anticoagulants: Secondary | ICD-10-CM | POA: Insufficient documentation

## 2020-12-18 DIAGNOSIS — J3489 Other specified disorders of nose and nasal sinuses: Secondary | ICD-10-CM | POA: Insufficient documentation

## 2020-12-18 DIAGNOSIS — Z20822 Contact with and (suspected) exposure to covid-19: Secondary | ICD-10-CM | POA: Insufficient documentation

## 2020-12-18 DIAGNOSIS — J069 Acute upper respiratory infection, unspecified: Secondary | ICD-10-CM

## 2020-12-18 DIAGNOSIS — R0981 Nasal congestion: Secondary | ICD-10-CM | POA: Insufficient documentation

## 2020-12-18 LAB — RESP PANEL BY RT-PCR (FLU A&B, COVID) ARPGX2
Influenza A by PCR: NEGATIVE
Influenza B by PCR: NEGATIVE
SARS Coronavirus 2 by RT PCR: NEGATIVE

## 2020-12-18 NOTE — ED Provider Notes (Signed)
[facility] EMERGENCY DEPARTMENT Provider Note   CSN: [id number] Arrival date & time: 12/18/20  1018     History Chief Complaint  Patient presents with  . URI    Andrew [name] is a 30 y.o. male Who presents on request of his employer for 2 days of nonproductive cough, fatigue, sore throat, congestion.  His sister whom he was exposed to has influenza.  He denies any nausea, vomiting, or diarrhea.  Denies known fevers.  Has been using Benadryl and Vicks cough syrup for his symptoms at home.  I personally reviewed the patient's medical records.  History of polysubstance abuse.  Had bilateral pulmonary embolus in the past and was treated with warfarin, restates while he was incarcerated they did not administer this medication and therefore has not been on it for quite some time.  He has not followed up for this but denies any difficulty breathing, shortness of breath, chest pain, or palpitations at this time.  He does not wish to discuss it any further.  2 days of upper respiratory symptoms who presents with request for work note and COVID-19 test.  Medicines are normal in intake.  Cardiopulmonary seems normal.  Abdominal exam is benign.  HEENT exam with clear rhinorrhea, posterior pharyngeal erythema, otherwise unremarkable.  Restaurant vaginal panel negative for COVID-19, influenza.  Who presents with 2 days of upper respiratory symptoms.  Vital signs are normal and intact.  Cardiopulmonary and abdominal exams are benign.  HEENT exam with posterior pharyngeal erythema and clear rhinorrhea, otherwise unremarkable.       Past Medical History:  Diagnosis Date  . Bilateral pulmonary embolism (HCC) 10/19/2019  . Cannabis use disorder, moderate, dependence (HCC) 06/07/2018  . Chronic back pain   . Cold left foot 08/21/2019  . Depression   . History of rhabdomyolysis 10/19/2019  . Left leg weakness 08/21/2019  . Multifocal pneumonia 10/19/2019  . Polysubstance abuse (HCC)  08/21/2019  . Rhabdomyolysis 08/21/2019  . Schizophrenia (HCC) 06/07/2018    Patient Active Problem List   Diagnosis Date Noted  . Long term (current) use of anticoagulants 10/31/2019  . Depression   . Chronic back pain   . Bilateral pulmonary embolism (HCC) 10/19/2019  . History of rhabdomyolysis 10/19/2019  . Multifocal pneumonia 10/19/2019  . Left leg weakness 08/21/2019  . Cold left foot 08/21/2019  . Polysubstance abuse (HCC) 08/21/2019  . Rhabdomyolysis 08/21/2019  . Cannabis use disorder, moderate, dependence (HCC) 06/07/2018  . Schizophrenia (HCC) 06/07/2018    Past Surgical History:  Procedure Laterality Date  . NONE         Family History  Family history unknown: Yes    Social History   Tobacco Use  . Smoking status: Every Day    Packs/day: 1.00    Types: Cigarettes  . Smokeless tobacco: Never  Vaping Use  . Vaping Use: Never used  Substance Use Topics  . Alcohol use: Yes    Comment: occassional  . Drug use: Yes    Types: Marijuana, Cocaine    Comment: denies today 12/31/19    Home Medications Prior to Admission medications   Medication Sig Start Date End Date Taking? Authorizing Provider  esomeprazole (NEXIUM) 40 MG capsule Take 1 capsule (40 mg total) by mouth daily. 11/23/19   Charlynne Pander, MD  gabapentin (NEURONTIN) 100 MG capsule Take 1 capsule (100 mg total) by mouth 3 (three) times daily. 12/31/19   Hyman Hopes, Margaux, PA-C  mirtazapine (REMERON SOL-TAB) 15 MG disintegrating tablet Take  1 tablet (15 mg total) by mouth at bedtime. Patient not taking: Reported on 11/23/2019 10/29/19 11/28/19  Briant Cedar, MD  mirtazapine (REMERON) 15 MG tablet Take 15 mg by mouth at bedtime. 10/29/19   [provider]  mirtazapine (REMERON) 15 MG tablet TAKE 1 TABLET (15 MG TOTAL) BY MOUTH AT BEDTIME. 10/29/19 10/28/20  Briant Cedar, MD  warfarin (COUMADIN) 5 MG tablet Take 2 tablets (10 mg total) by mouth daily. MUST MAKE COUMADIN  APPOINTMENT FOR FURTHER REFILLS 12/17/19   Baldo Daub, MD  warfarin (COUMADIN) 5 MG tablet TAKE 2 TABLETS (10 MG) BY MOUTH DAILY OR AS DIRECTED BY ANTICOAGULATION CLINIC. 10/29/19 10/28/20  Briant Cedar, MD    Allergies    Haloperidol lactate and Penicillins  Review of Systems   Review of Systems  Constitutional:  Positive for activity change, appetite change, chills and fatigue. Negative for fever.  HENT:  Positive for congestion, rhinorrhea, sinus pressure and sore throat.   Eyes: Negative.   Respiratory:  Positive for cough. Negative for chest tightness and shortness of breath.   Cardiovascular: Negative.   Gastrointestinal: Negative.   Neurological:  Positive for headaches. Negative for dizziness, weakness and light-headedness.   Physical Exam Updated Vital Signs BP 134/85 (BP Location: Right Arm)   Pulse 63   Temp 98.6 F (37 C) (Oral)   Resp 18   Ht 6' (1.829 m)   Wt 93 kg   SpO2 99%   BMI 27.80 kg/m   Physical Exam Vitals and nursing note reviewed.  Constitutional:      Appearance: He is not toxic-appearing.  HENT:     Head: Normocephalic and atraumatic.     Nose: Congestion and rhinorrhea present. Rhinorrhea is clear.     Mouth/Throat:     Mouth: Mucous membranes are moist.     Pharynx: Oropharynx is clear. Uvula midline. Posterior oropharyngeal erythema present. No oropharyngeal exudate.     Tonsils: No tonsillar exudate or tonsillar abscesses.  Eyes:     General:        Right eye: No discharge.        Left eye: No discharge.     Extraocular Movements: Extraocular movements intact.     Conjunctiva/sclera: Conjunctivae normal.     Pupils: Pupils are equal, round, and reactive to light.  Neck:     Trachea: Trachea normal.  Cardiovascular:     Rate and Rhythm: Normal rate and regular rhythm.     Pulses: Normal pulses.     Heart sounds: Normal heart sounds. No murmur heard. Pulmonary:     Effort: Pulmonary effort is normal. No respiratory  distress.     Breath sounds: Normal breath sounds. No wheezing or rales.  Abdominal:     General: Bowel sounds are normal. There is no distension.     Tenderness: There is no abdominal tenderness. There is no guarding or rebound.  Musculoskeletal:        General: No deformity.     Cervical back: Normal range of motion and neck supple. No tenderness or crepitus. No pain with movement, spinous process tenderness or muscular tenderness.     Right lower leg: No edema.     Left lower leg: No edema.  Lymphadenopathy:     Cervical: No cervical adenopathy.  Skin:    General: Skin is warm and dry.     Capillary Refill: Capillary refill takes less than 2 seconds.     Findings: No rash.  Neurological:  General: No focal deficit present.     Mental Status: He is alert and oriented to person, place, and time. Mental status is at baseline.  Psychiatric:        Mood and Affect: Mood normal.    ED Results / Procedures / Treatments   Labs (all labs ordered are listed, but only abnormal results are displayed) Labs Reviewed  RESP PANEL BY RT-PCR (FLU A&B, COVID) ARPGX2    EKG None  Radiology No results found.  Procedures Procedures   Medications Ordered in ED Medications - No data to display  ED Course  I have reviewed the triage vital signs and the nursing notes.  Pertinent labs & imaging results that were available during my care of the patient were reviewed by me and considered in my medical decision making (see chart for details).    MDM Rules/Calculators/A&P                         30 year old with 2 days of URI symptoms.  VS normal on intake, cardiopulmonary exam and abdominal exam are benign. HEENT exam with clear rhinorrhea, posterior pharyngeal erythema without exudate.   RPP negative for COVID-19 and influenza. NO further work up warranted in the ED at this time. Suspect other viral URI.   Andrew [name] voiced understanding of his medical evaluation and treatment plan. Each  of his questions was answered to his expressed satisfaction. Return precautions were given. Patient is well-appearing, stable, and appropriate for discharge at this time.   This chart was dictated using voice recognition software, Dragon. Despite the best efforts of this provider to proofread and correct errors, errors may still occur which can change documentation meaning.   Final Clinical Impression(s) / ED Diagnoses Final diagnoses:  None    Rx / DC Orders ED Discharge Orders     None        Sherrilee Gilles 12/18/20 1230    Kommor, Wyn Forster, MD 12/18/20 [phone]

## 2020-12-18 NOTE — ED Triage Notes (Addendum)
Pt c/o cough, fatigue, sore throat x 2 days. Sister has the flu

## 2020-12-18 NOTE — Discharge Instructions (Addendum)
You are seen in the ER today for your sore throat and runny nose.  She has negative COVID-19 and influenza.  You likely have another acute viral upper respiratory infection.  You may use acetaminophen or ibuprofen as needed for your discomfort and/or fever.  You may use over-the-counter throat sprays and throat lozenges for your sore throat.  [place]ays over-the-counter decongestants as needed for your runny nose.  You may follow-up with your primary care doctor and return to the ER if develop any difficulty breathing, nausea or vomiting that does not stop, or any other new severe symptoms.

## 2021-12-31 IMAGING — CT CT ANGIO AOBIFEM WO/W CM
2 of 12 series · 11 of 46 positions shown, 15 images · IV contrast (Omnipaque)
Comparison: None.

CLINICAL DATA: Arterial embolization. Back pain and left leg pain
and swelling.

EXAM:
CT ANGIOGRAPHY OF ABDOMINAL AORTA WITH ILIOFEMORAL RUNOFF
TECHNIQUE: Multidetector CT imaging of the abdomen, pelvis and lower
extremities was performed using the standard protocol during bolus
administration of intravenous contrast. Multiplanar CT image
reconstructions and MIPs were obtained to evaluate the vascular
anatomy.
CONTRAST:  100mL OMNIPAQUE IOHEXOL 350 MG/ML SOLN

[Series 6: runoff axial arterial · axial · arterial · 0.98mm/px · z∈[-802,-1]mm · 10 of 309 slices shown, 13 images]
[im 21/309  soft-tissue]
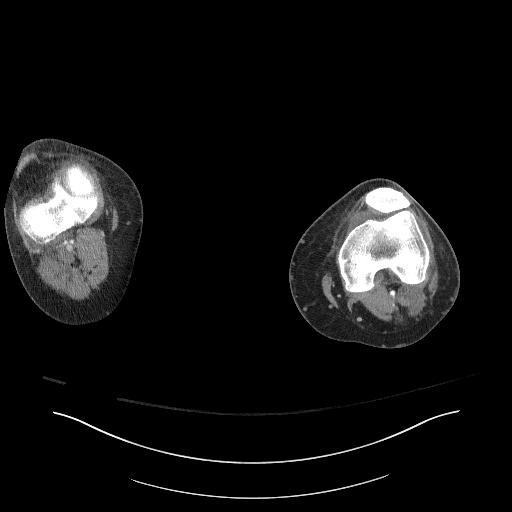
[im 21/309  bone]
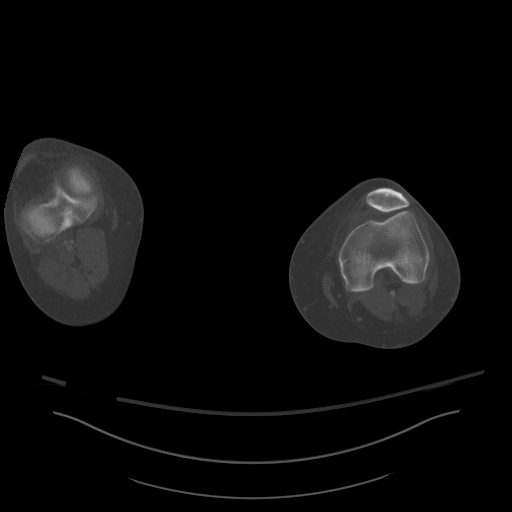
[im 62/309  soft-tissue]
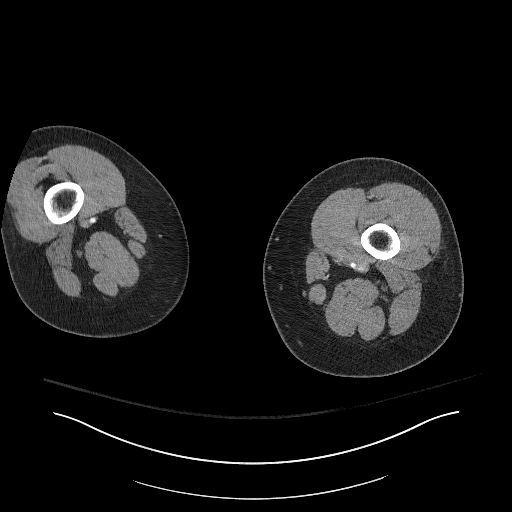
[im 103/309  soft-tissue]
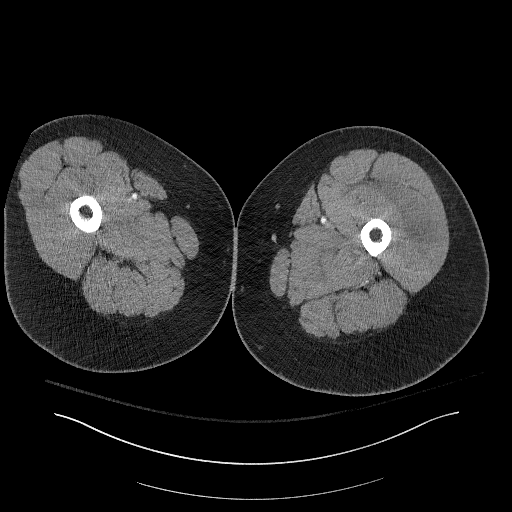
[im 144/309  soft-tissue]
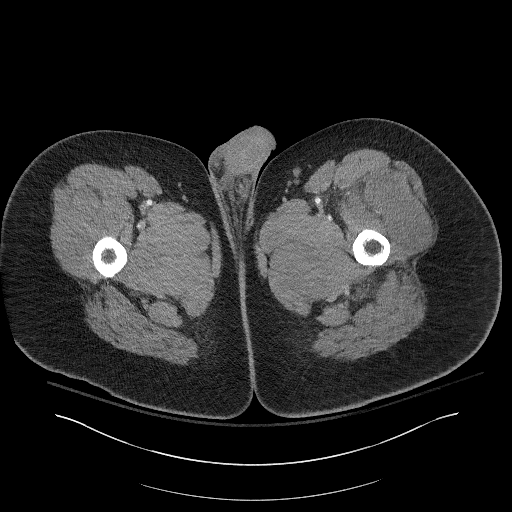
[im 165/309  soft-tissue]
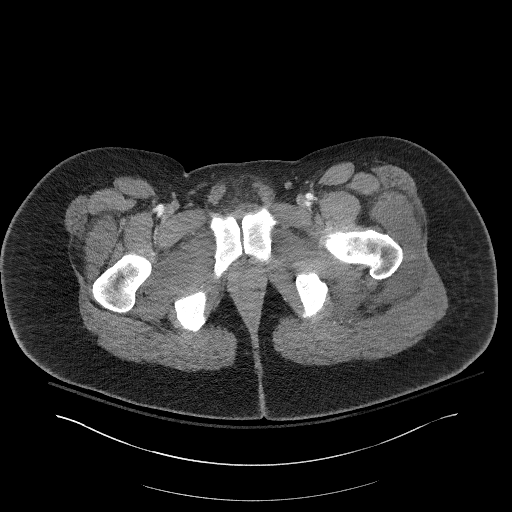
[im 206/309  soft-tissue]
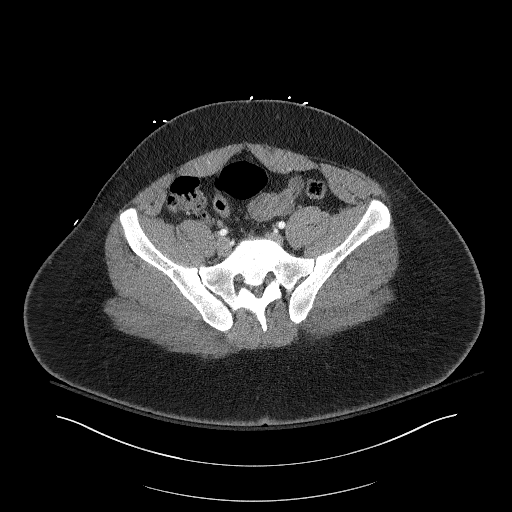
[im 226/309  lung]
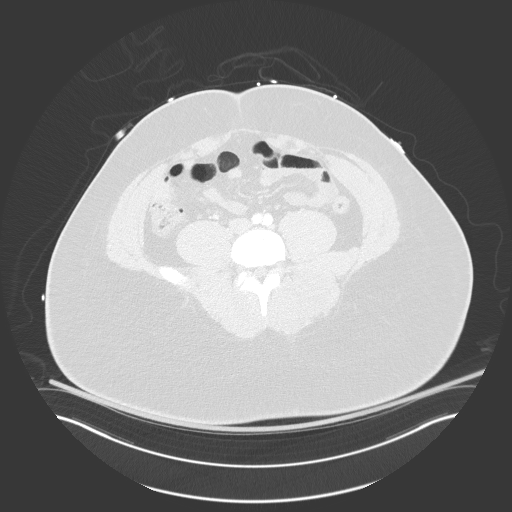
[im 247/309  soft-tissue]
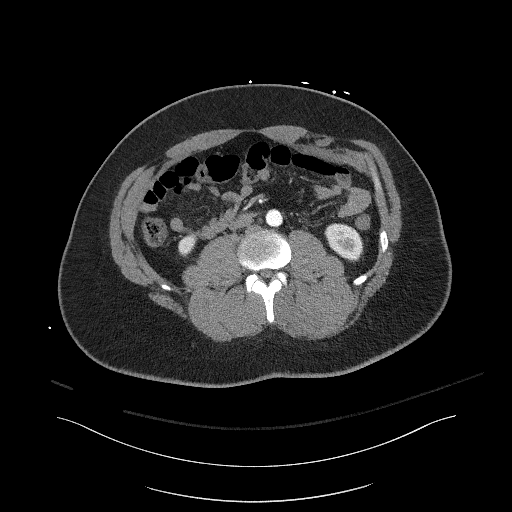
[im 247/309  lung]
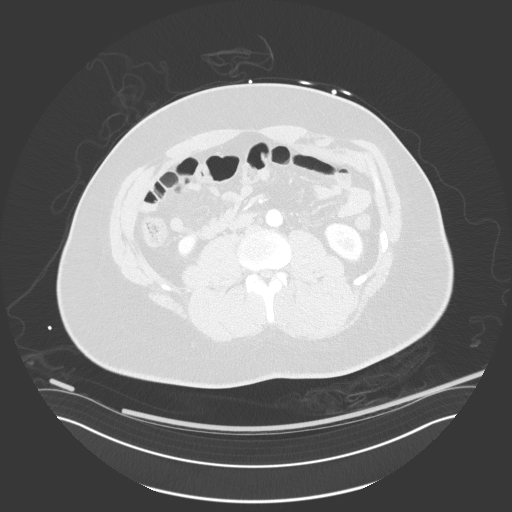
[im 267/309  lung]
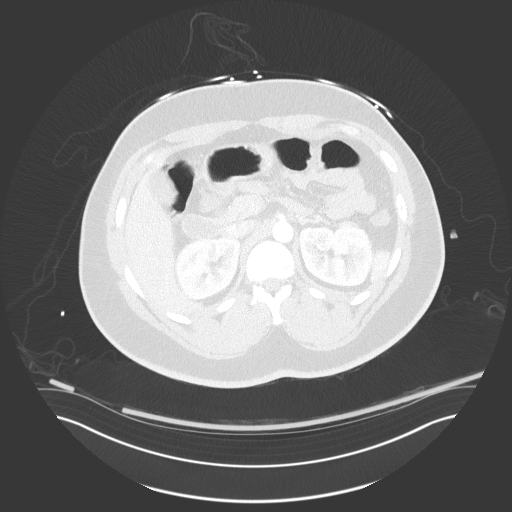
[im 288/309  soft-tissue]
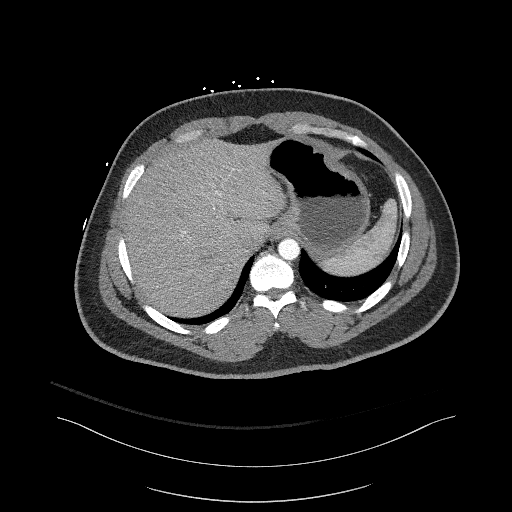
[im 288/309  lung]
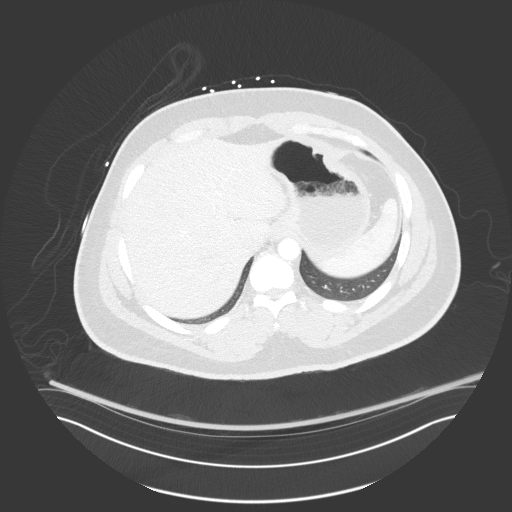

[Series 8: coronal upper · coronal · 0.97mm/px · 1 of 180 slices shown, 2 images]
[im 90/180  soft-tissue]
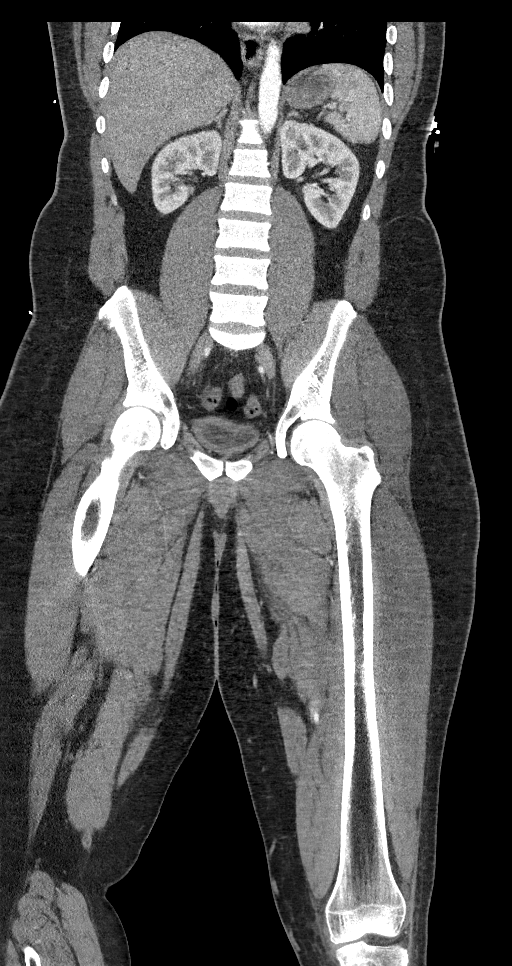
[im 90/180  bone]
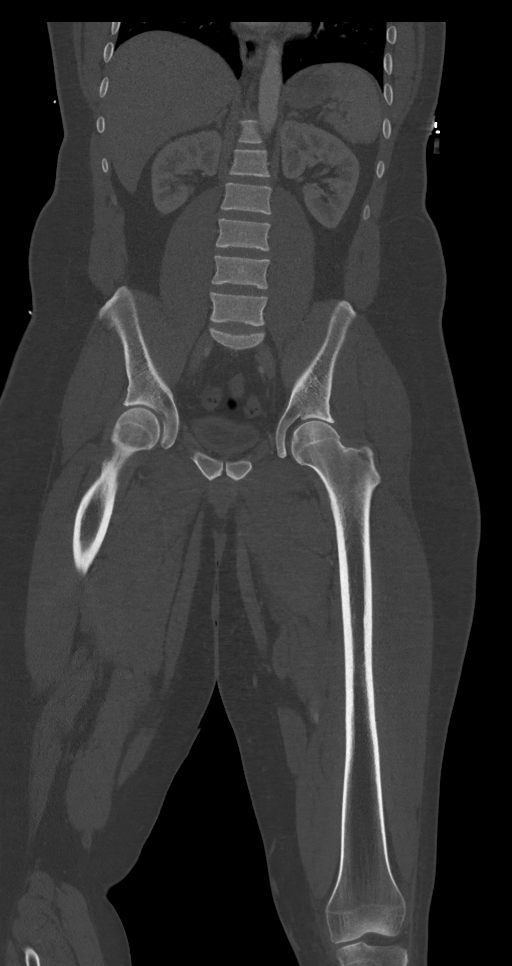

[11 of 46 positions shown; findings below may reference images not displayed]

FINDINGS: VASCULAR

Aorta: Normal caliber aorta without aneurysm, dissection, vasculitis
or significant stenosis.

Celiac: Patent without evidence of aneurysm, dissection, vasculitis
or significant stenosis.

SMA: Patent without evidence of aneurysm, dissection, vasculitis or
significant stenosis.

Renals: Both renal arteries are patent without evidence of aneurysm,
dissection, vasculitis, fibromuscular dysplasia or significant
stenosis.

IMA: Patent without evidence of aneurysm, dissection, vasculitis or
significant stenosis.

RIGHT Lower Extremity

Inflow: Common, internal and external iliac arteries are patent
without evidence of aneurysm, dissection, vasculitis or significant
stenosis.

Outflow: Common, superficial and profunda femoral arteries and the
popliteal artery are patent without evidence of aneurysm,
dissection, vasculitis or significant stenosis.

Runoff: There appears to be a 3 vessel runoff to the level ankle,
however evaluation is limited by venous contamination.

LEFT Lower Extremity

Inflow: Common, internal and external iliac arteries are patent
without evidence of aneurysm, dissection, vasculitis or significant
stenosis.

Outflow: Common, superficial and profunda femoral arteries and the
popliteal artery are patent without evidence of aneurysm,
dissection, vasculitis or significant stenosis.

Runoff: There appears to be a 3 vessel runoff to the level of the
ankle, however evaluation is limited by venous contamination.

Veins: No obvious venous abnormality within the limitations of this
arterial phase study.

Review of the MIP images confirms the above findings.

NON-VASCULAR

Lower chest: The lung bases are clear. The heart size is normal.

Hepatobiliary: The liver is normal. Normal gallbladder.There is no
biliary ductal dilation.

Pancreas: Normal contours without ductal dilatation. No
peripancreatic fluid collection.

Spleen: Unremarkable.

Adrenals/Urinary Tract:

--Adrenal glands: Unremarkable.

--Right kidney/ureter: No hydronephrosis or radiopaque kidney
stones.

--Left kidney/ureter: No hydronephrosis or radiopaque kidney stones.

--Urinary bladder: Unremarkable.

Stomach/Bowel:

--Stomach/Duodenum: No hiatal hernia or other gastric abnormality.
Normal duodenal course and caliber.

--Small bowel: Unremarkable.

--Colon: Unremarkable.

--Appendix: Normal.

Lymphatic:

--No retroperitoneal lymphadenopathy.

--No mesenteric lymphadenopathy.

--No pelvic or inguinal lymphadenopathy.

Reproductive: Unremarkable

Other: No ascites or free air. The abdominal wall is normal.

Musculoskeletal. There is hypoenhancement enlargement of the
bilateral operator externus muscles as well as the left gluteal
musculature and muscles of the anterior compartment of the proximal
left thigh. There is asymmetric enlargement of the left soleus
muscle which is also hypoenhancing. There is no rim enhancing fluid
collection. There appears to be a soft tissue contusion involving
the medial right lower extremity at the level of the mid tibia.
There are small bilateral suprapatellar joint effusions.
IMPRESSION: 1. No evidence for an acute arterial abnormality. There appears to
be a 3 vessel runoff to both ankles, however evaluation is limited
by venous contamination.
2. Enlargement and hypoenhancement of multiple muscles of the left
lower extremity as detailed above. This is a nonspecific finding.
Differential considerations include rhabdomyolysis,
myositis/pyomyositis, versus less likely autoimmune myositis or
necrotizing fasciitis.

## 2023-04-08 IMAGING — DX DG CHEST 1V
1 series · 1 of 1 positions shown · non-contrast
Comparison: Chest x-ray 11/23/2019.

CLINICAL DATA: Pre-employment exam.

EXAM:
CHEST  1 VIEW

[chest pa]
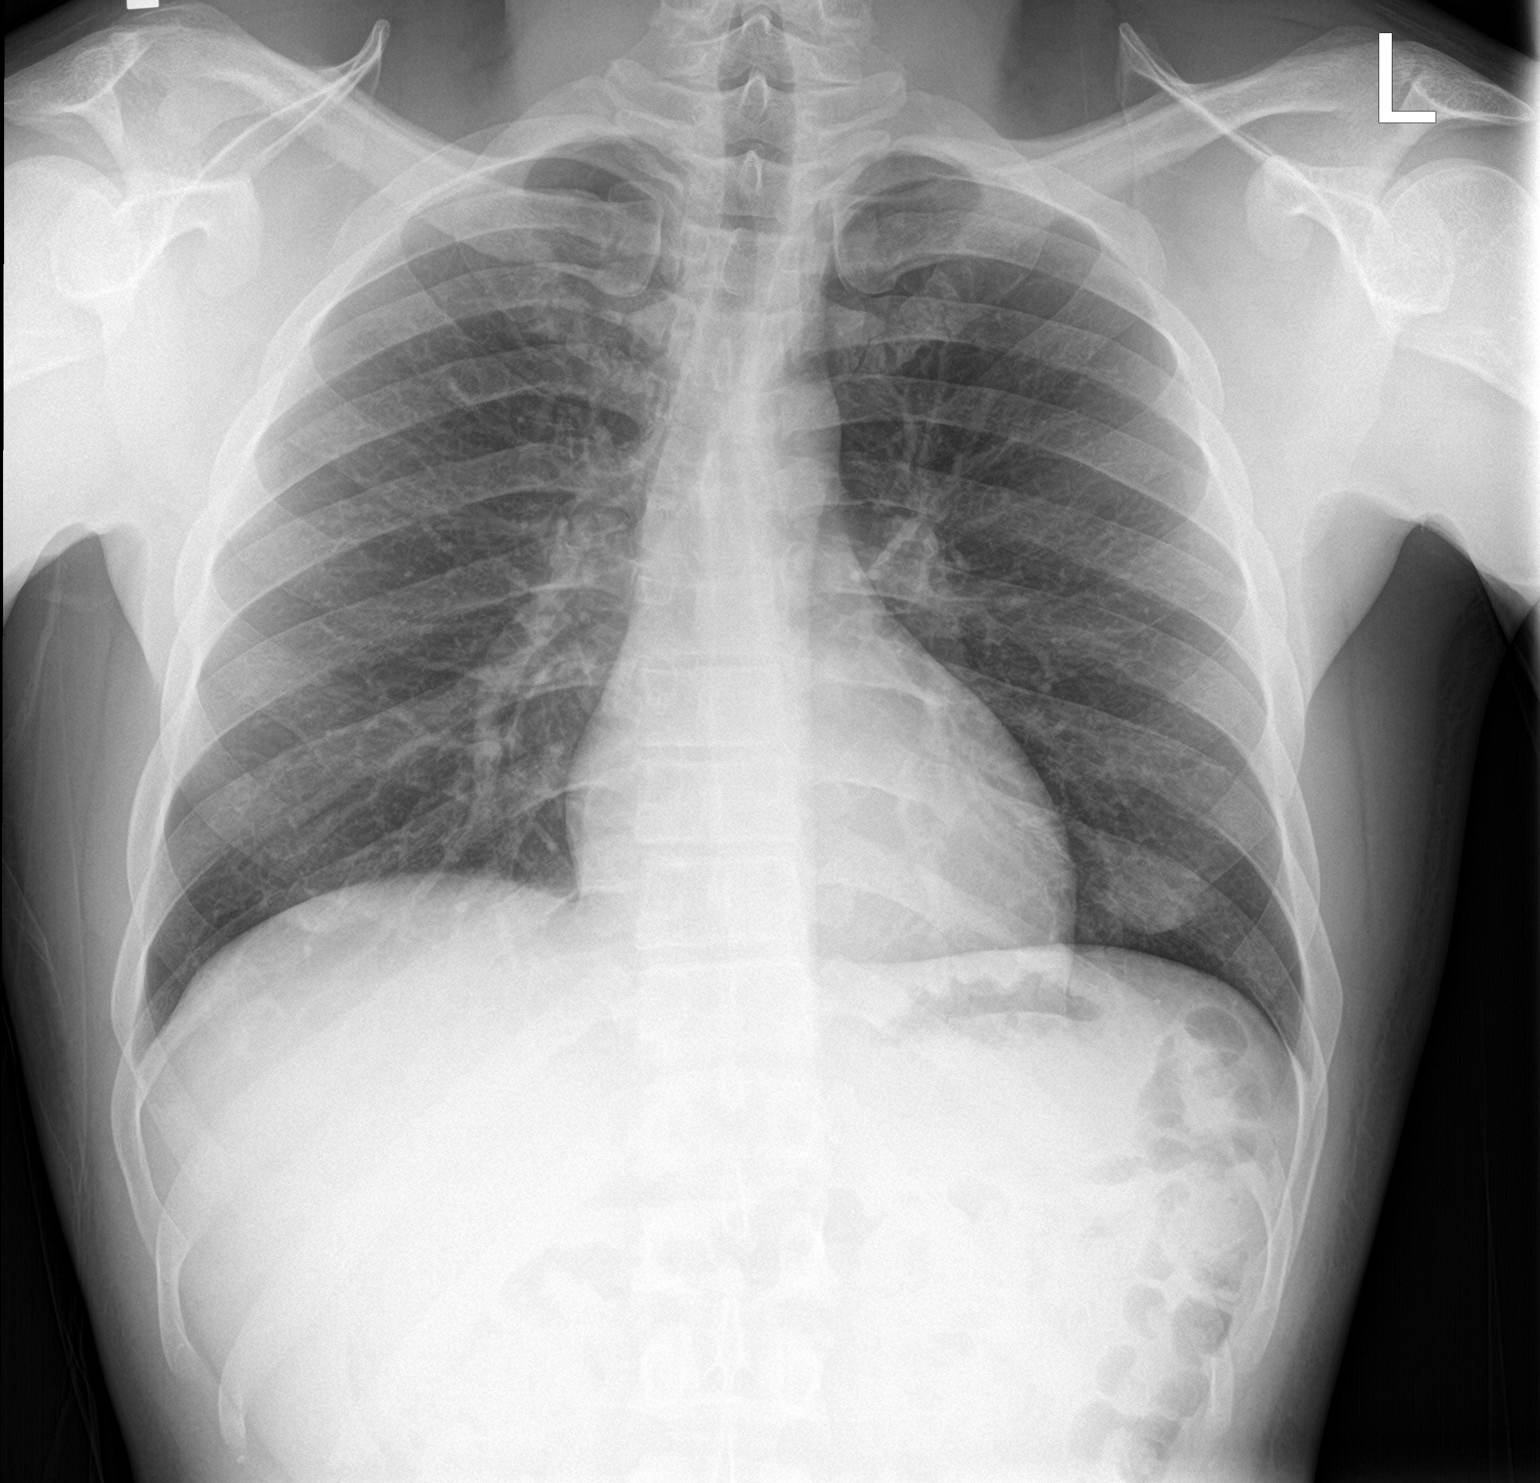

[1 of 1 positions shown; findings below may reference images not displayed]

FINDINGS: Small focal density overlying the left lower lung is likely related
to overlying soft tissue artifact. The lungs are otherwise clear.
There is no pleural effusion or pneumothorax. The cardiomediastinal
silhouette is within normal limits. Osseous structures are within
normal limits.
IMPRESSION: No active disease.

## 2023-05-04 ENCOUNTER — Other Ambulatory Visit: Payer: Self-pay

## 2023-05-04 ENCOUNTER — Emergency Department (HOSPITAL_BASED_OUTPATIENT_CLINIC_OR_DEPARTMENT_OTHER)
Admission: EM | Admit: 2023-05-04 | Discharge: 2023-05-04 | Disposition: A | Discharge status: Home / Self Care | Payer: Self-pay | Attending: Emergency Medicine | Admitting: Emergency Medicine

## 2023-05-04 ENCOUNTER — Encounter (HOSPITAL_BASED_OUTPATIENT_CLINIC_OR_DEPARTMENT_OTHER): Payer: Self-pay | Admitting: Emergency Medicine

## 2023-05-04 DIAGNOSIS — R112 Nausea with vomiting, unspecified: Secondary | ICD-10-CM | POA: Insufficient documentation

## 2023-05-04 DIAGNOSIS — R197 Diarrhea, unspecified: Secondary | ICD-10-CM | POA: Insufficient documentation

## 2023-05-04 DIAGNOSIS — A059 Bacterial foodborne intoxication, unspecified: Secondary | ICD-10-CM

## 2023-05-04 DIAGNOSIS — Z7901 Long term (current) use of anticoagulants: Secondary | ICD-10-CM | POA: Insufficient documentation

## 2023-05-04 MED ORDER — ONDANSETRON HCL 4 MG PO TABS: 4.0000 mg | ORAL_TABLET | Freq: Four times a day (QID) | ORAL | 0 refills | Status: AC | PRN

## 2023-05-04 MED ORDER — SODIUM CHLORIDE 0.9 % IV SOLN
6.0000 mg | Freq: Once | INTRAVENOUS | Status: DC
  Filled 2023-05-04: qty 3

## 2023-05-04 MED ORDER — SODIUM CHLORIDE 0.9 % IV BOLUS: 1000.0000 mL | Freq: Once | INTRAVENOUS | Status: DC

## 2023-05-04 MED ORDER — ONDANSETRON HCL 4 MG/2ML IJ SOLN
INTRAMUSCULAR | Status: AC
  Filled 2023-05-04: qty 4

## 2023-05-04 MED ORDER — ONDANSETRON 4 MG PO TBDP
4.0000 mg | ORAL_TABLET | Freq: Once | ORAL | Status: AC
  Administered 2023-05-04: 4 mg via ORAL
  Filled 2023-05-04: qty 1

## 2023-05-04 MED ORDER — KETOROLAC TROMETHAMINE 15 MG/ML IJ SOLN
15.0000 mg | Freq: Once | INTRAMUSCULAR | Status: AC
Start: 2023-05-04 — End: 2023-05-04
  Administered 2023-05-04: 15 mg via INTRAMUSCULAR
  Filled 2023-05-04: qty 1

## 2023-05-04 NOTE — ED Notes (Signed)
 I walked in to introduce myself and the pt started off asking what I had in my hand. The pt advised he didn't want an IV or fluids, he just wanted a Gatorade to drink for electrolyte replinishment. The pt denied wanting his labs drawn and just wanted a shot for pain and something for nausea. I informed him I had a medicine mixed for nausea already, I just needed to initiate an IV. The pt seemed hesitant and I did not force the issue. I informed the EDP and they discussed his careplan. The pt opted for PO Zofran  and to receive an IM injection and go home for a PO challenge. He had a sip of Gatorlyte in the ER without issue.

## 2023-05-04 NOTE — Discharge Instructions (Addendum)
 You came to the emergency department for nausea and vomiting likely from food poisoning from your fish sandwich with tartar sauce that you ate at 1 AM this morning.  There is also norovirus going around right now which is a viral stomach virus that causes nausea, vomiting, diarrhea.  I sent a prescription of Zofran  to the pharmacy for you.  This is an antinausea medication.  You received 1 in the emergency department for you left at 10:30 AM.  It safe to take every 6 hours so your next dose would be approximately 4:30 PM if needed.  If you start to experience diarrhea I would pick up an over-the-counter medication called loperamide.  This can help slow down diarrhea so that you do not get an electrolyte abnormality.  I would wait 24 hours after diarrhea starts before taking this medication.  Sometimes it is better to let the toxins get out after a food borne illness.  Eat a bland diet without any spicy, caffeinated, or fried foods for the next 3 days.

## 2023-05-04 NOTE — ED Notes (Signed)

## 2023-05-04 NOTE — ED Provider Notes (Signed)
 [place] EMERGENCY DEPARTMENT AT [facility] Provider Note   CSN: [id number] Arrival date & time: 05/04/23  4098     History  Chief Complaint  Patient presents with   Emesis    Andrew [name] is a 33 y.o. male.  Patient is a 32-year male coming in for nausea vomiting and diarrhea.  Patient states that he ate a fish sandwich with tartar sauce on it that he reheated last night at 1 AM.  He states that approximately 5 to 6 AM this morning he began having audible episodes of nausea and vomiting.  Few episodes of diarrhea.  Admits to generalized abdominal tightening and discomfort without any focal pain.  Sick contacts.  No foreign travel.  Black or bloody stools.  The history is provided by the patient. No language interpreter was used.  Emesis Associated symptoms: abdominal pain and diarrhea   Associated symptoms: no arthralgias, no chills, no cough, no fever and no sore throat        Home Medications Prior to Admission medications   Medication Sig Start Date End Date Taking? Authorizing Provider  ondansetron  (ZOFRAN ) 4 MG tablet Take 1 tablet (4 mg total) by mouth every 6 (six) hours as needed for nausea or vomiting. 05/04/23  Yes Owen Blowers P, DO  esomeprazole  (NEXIUM ) 40 MG capsule Take 1 capsule (40 mg total) by mouth daily. 11/23/19   Dalene Duck, MD  gabapentin  (NEURONTIN ) 100 MG capsule Take 1 capsule (100 mg total) by mouth 3 (three) times daily. 12/31/19   Venter, Margaux, PA-C  mirtazapine  (REMERON  SOL-TAB) 15 MG disintegrating tablet Take 1 tablet (15 mg total) by mouth at bedtime. Patient not taking: Reported on 11/23/2019 10/29/19 11/28/19  [name], Nkeiruka J, MD  mirtazapine  (REMERON ) 15 MG tablet Take 15 mg by mouth at bedtime. 10/29/19   [provider]  mirtazapine  (REMERON ) 15 MG tablet TAKE 1 TABLET (15 MG TOTAL) BY MOUTH AT BEDTIME. 10/29/19 10/28/20  [name], Nkeiruka J, MD  warfarin (COUMADIN ) 5 MG tablet Take 2 tablets  (10 mg total) by mouth daily. MUST MAKE COUMADIN  APPOINTMENT FOR FURTHER REFILLS 12/17/19   Hassan Links, MD  warfarin (COUMADIN ) 5 MG tablet TAKE 2 TABLETS (10 MG) BY MOUTH DAILY OR AS DIRECTED BY ANTICOAGULATION CLINIC. 10/29/19 10/28/20  [name], Nkeiruka J, MD      Allergies    Haloperidol lactate and Penicillins    Review of Systems   Review of Systems  Constitutional:  Negative for chills and fever.  HENT:  Negative for ear pain and sore throat.   Eyes:  Negative for pain and visual disturbance.  Respiratory:  Negative for cough and shortness of breath.   Cardiovascular:  Negative for chest pain and palpitations.  Gastrointestinal:  Positive for abdominal pain, diarrhea, nausea and vomiting.  Genitourinary:  Negative for dysuria and hematuria.  Musculoskeletal:  Negative for arthralgias and back pain.  Skin:  Negative for color change and rash.  Neurological:  Negative for seizures and syncope.  All other systems reviewed and are negative.   Physical Exam Updated Vital Signs BP 134/66 (BP Location: Left Arm)   Pulse (!) 43   Temp (!) 97.3 F (36.3 C)   Resp 18   Wt 94.8 kg   SpO2 100%   BMI 28.35 kg/m  Physical Exam Vitals and nursing note reviewed.  Constitutional:      General: He is not in acute distress.    Appearance: He is well-developed.  HENT:  Head: Normocephalic and atraumatic.  Eyes:     Conjunctiva/sclera: Conjunctivae normal.  Cardiovascular:     Rate and Rhythm: Normal rate and regular rhythm.     Heart sounds: No murmur heard. Pulmonary:     Effort: Pulmonary effort is normal. No respiratory distress.     Breath sounds: Normal breath sounds.  Abdominal:     Palpations: Abdomen is soft.     Tenderness: There is no abdominal tenderness.  Musculoskeletal:        General: No swelling.     Cervical back: Neck supple.  Skin:    General: Skin is warm and dry.     Capillary Refill: Capillary refill takes less than 2 seconds.  Neurological:      Mental Status: He is alert.  Psychiatric:        Mood and Affect: Mood normal.     ED Results / Procedures / Treatments   Labs (all labs ordered are listed, but only abnormal results are displayed) Labs Reviewed - No data to display   EKG None  Radiology No results found.  Procedures Procedures    Medications Ordered in ED Medications  ondansetron  (ZOFRAN -ODT) disintegrating tablet 4 mg (has no administration in time range)  ketorolac  (TORADOL ) 15 MG/ML injection 15 mg (has no administration in time range)    ED Course/ Medical Decision Making/ A&P                                 Medical Decision Making Amount and/or Complexity of Data Reviewed Labs: ordered.  Risk Prescription drug management.   10:24 AM 32-year male coming in for nausea vomiting and diarrhea.  Patient is alert oriented x 3, no acute distress, nontoxic-appearing, afebrile, stable vital signs.  Physical exam demonstrates soft abdomen with no focal tenderness.  Given patient's presenting history of warming up a fish sandwich with tartar sauce at 1 AM and developing symptoms not too long after after high suspicion for foodborne illness.  Norovirus is also going around right now.  Will treat with IV fluids and Zofran .  Will recommend loperamide after 24 hours if diarrhea does not improve.  No need for laboratory testing at this time.  Abdomen remains soft and nontender.  Low suspicion any acute life-threatening etiologies.    Patient changed his mind and does not want iv fluids. Zofran  and Toradol  given.   Patient in no distress and overall condition improved here in the ED. Detailed discussions were had with the patient regarding current findings, and need for close f/u with PCP or on call doctor. The patient has been instructed to return immediately if the symptoms worsen in any way for re-evaluation. Patient verbalized understanding and is in agreement with current care plan. All questions answered  prior to discharge.         Final Clinical Impression(s) / ED Diagnoses Final diagnoses:  Food poisoning-versus norovirus    Rx / DC Orders ED Discharge Orders          Ordered    ondansetron  (ZOFRAN ) 4 MG tablet  Every 6 hours PRN        05/04/23 1021              Quinn Bucco, DO 05/04/23 1024

## 2023-05-04 NOTE — ED Triage Notes (Signed)
 Emesis and diarrhea x 6 am this morning , chills , chest pain with emesis .  Hx PE .

## 2023-05-15 ENCOUNTER — Emergency Department (HOSPITAL_BASED_OUTPATIENT_CLINIC_OR_DEPARTMENT_OTHER)
Admission: EM | Admit: 2023-05-15 | Discharge: 2023-05-15 | Disposition: A | Discharge status: Home / Self Care | Attending: Emergency Medicine | Admitting: Emergency Medicine

## 2023-05-15 ENCOUNTER — Encounter (HOSPITAL_BASED_OUTPATIENT_CLINIC_OR_DEPARTMENT_OTHER): Payer: Self-pay | Admitting: Emergency Medicine

## 2023-05-15 ENCOUNTER — Emergency Department (HOSPITAL_BASED_OUTPATIENT_CLINIC_OR_DEPARTMENT_OTHER)

## 2023-05-15 DIAGNOSIS — M25511 Pain in right shoulder: Secondary | ICD-10-CM | POA: Diagnosis not present

## 2023-05-15 DIAGNOSIS — Y92410 Unspecified street and highway as the place of occurrence of the external cause: Secondary | ICD-10-CM | POA: Diagnosis not present

## 2023-05-15 DIAGNOSIS — M545 Low back pain, unspecified: Secondary | ICD-10-CM | POA: Insufficient documentation

## 2023-05-15 DIAGNOSIS — Z7901 Long term (current) use of anticoagulants: Secondary | ICD-10-CM | POA: Diagnosis not present

## 2023-05-15 DIAGNOSIS — R519 Headache, unspecified: Secondary | ICD-10-CM | POA: Diagnosis not present

## 2023-05-15 DIAGNOSIS — G8911 Acute pain due to trauma: Secondary | ICD-10-CM | POA: Insufficient documentation

## 2023-05-15 MED ORDER — LIDOCAINE 5 % EX PTCH
1.0000 | MEDICATED_PATCH | CUTANEOUS | Status: DC
  Administered 2023-05-15: 1 via TRANSDERMAL
  Filled 2023-05-15: qty 1

## 2023-05-15 MED ORDER — HYDROCODONE-ACETAMINOPHEN 5-325 MG PO TABS
1.0000 | ORAL_TABLET | Freq: Once | ORAL | Status: AC
Start: 2023-05-15 — End: 2023-05-15
  Administered 2023-05-15: 1 via ORAL
  Filled 2023-05-15: qty 1

## 2023-05-15 NOTE — Discharge Instructions (Addendum)
 Thank you for letting us  evaluate you today.  Your head imaging was negative for bleeding.  Your lower back was negative for fractures or traumatic injury.  I provided you with pain medicine and a lidocaine  patch here in emergency department.  Have also sent naproxen  and Robaxin  to your pharmacy. You may use naproxen  and Tylenol  intermittently every 8 hours as needed for pain.  Please do not use naproxen  with aspirin, Aleve , ibuprofen , Advil  as they are all in the same family. Robaxin  may cause drowsiness so do not operate heavy machinery including driving or drink alcohol with this.  You may take this at night or split the tablet in half if it makes you too drowsy.  Also please make sure to ice and elevate areas of pain to reduce pain and swelling.  Also make sure to have "brain rest" to include decrease screen time, low lites  Please follow up with PCP within next 7-10 days if symptoms do not improve.  You are likely to feel sore following  Return to emergency department if you experience altered mentation, seizures, repetitive questioning, vomiting, intractable pain
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
